# Patient Record
Sex: Female | Born: 1960 | Hispanic: Yes | Marital: Single | Smoking: Current every day smoker
Health system: Southern US, Community
[De-identification: ages and names within clinical notes are randomized; demographics above are authoritative.]

## PROBLEM LIST (undated history)

## (undated) DIAGNOSIS — F32A Depression, unspecified: Secondary | ICD-10-CM

## (undated) DIAGNOSIS — R51 Headache: Secondary | ICD-10-CM

## (undated) DIAGNOSIS — M069 Rheumatoid arthritis, unspecified: Secondary | ICD-10-CM

## (undated) DIAGNOSIS — R519 Headache, unspecified: Secondary | ICD-10-CM

## (undated) DIAGNOSIS — F329 Major depressive disorder, single episode, unspecified: Secondary | ICD-10-CM

## (undated) DIAGNOSIS — K760 Fatty (change of) liver, not elsewhere classified: Secondary | ICD-10-CM

## (undated) DIAGNOSIS — F419 Anxiety disorder, unspecified: Secondary | ICD-10-CM

## (undated) DIAGNOSIS — Z8659 Personal history of other mental and behavioral disorders: Secondary | ICD-10-CM

## (undated) HISTORY — PX: ABDOMINAL HYSTERECTOMY: SHX81

---

## 2010-01-01 ENCOUNTER — Emergency Department: Payer: Self-pay | Admitting: Emergency Medicine

## 2011-08-15 IMAGING — CT CT HEAD WITHOUT CONTRAST
2 series · 16 of 30 positions shown, 20 images · non-contrast
Comparison: none

REASON FOR EXAM: dizziness, unresolved with multiple medications
COMMENTS:   LMP: Post Hysterectomy

[Series 2: without · axial · non-contrast · 0.41mm/px · z∈[+315,+440]mm · 13 of 31 slices shown, 17 images]
[im 3/31  brain]
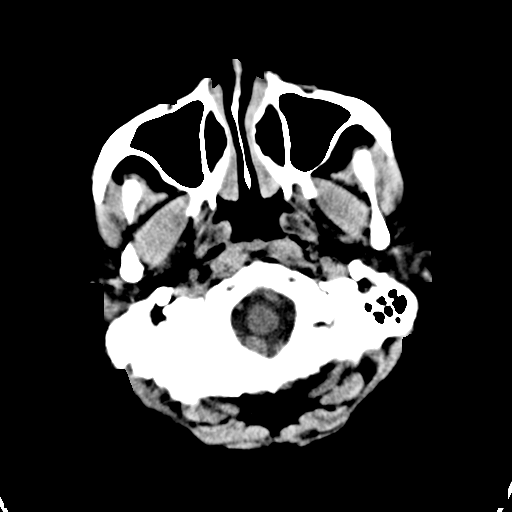
[im 3/31  bone]
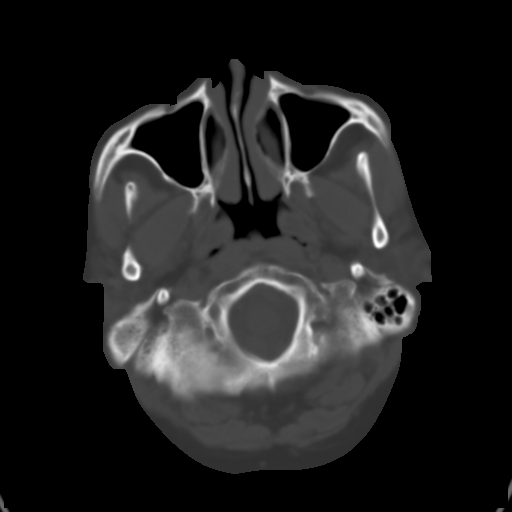
[im 5/31  brain]
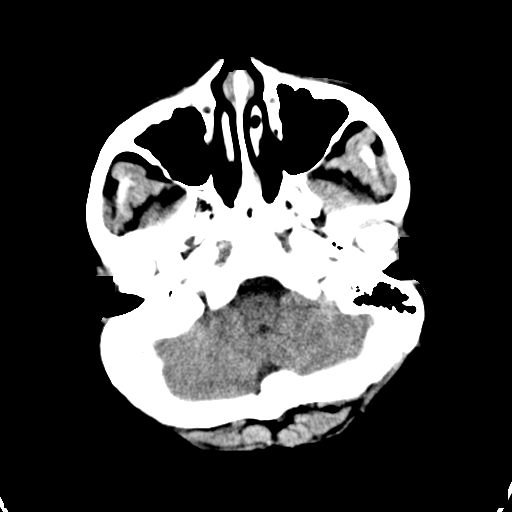
[im 7/31  brain]
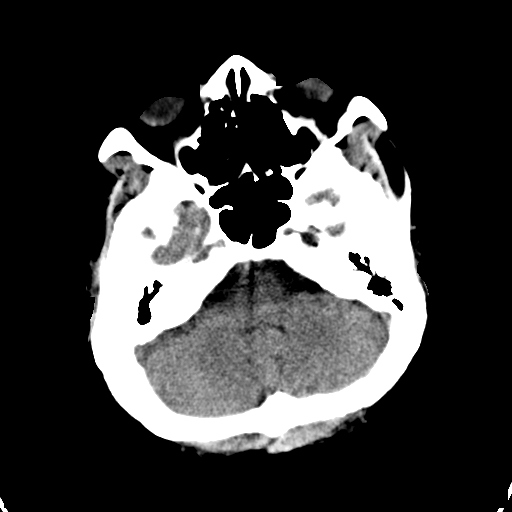
[im 9/31  brain]
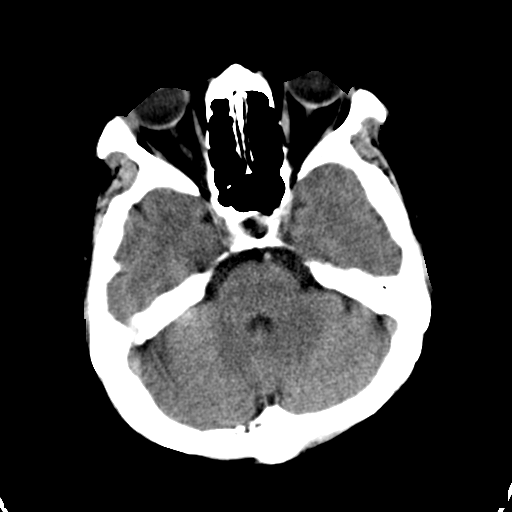
[im 11/31  brain]
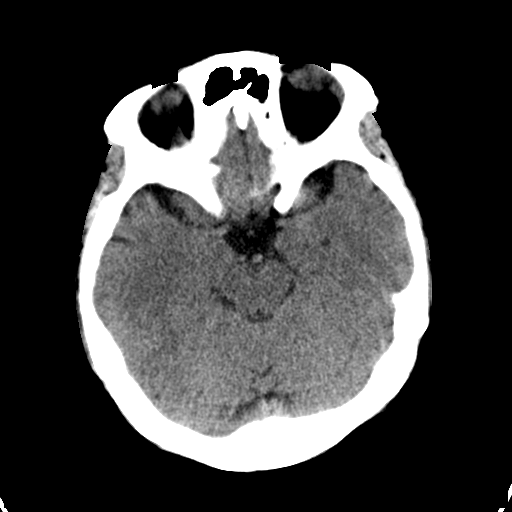
[im 11/31  bone]
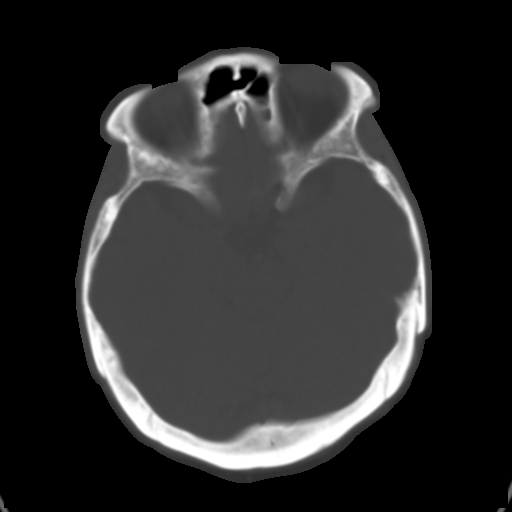
[im 13/31  brain]
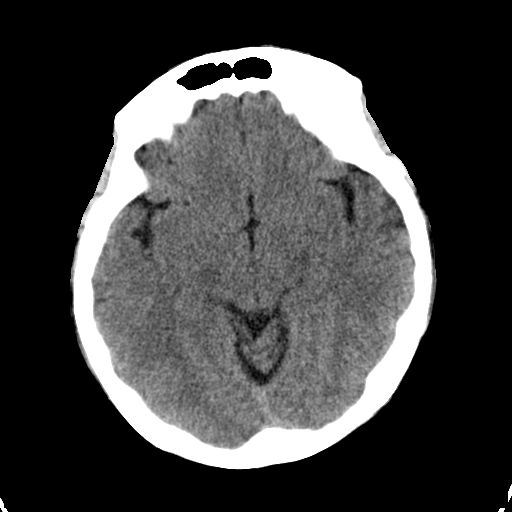
[im 16/31  brain]
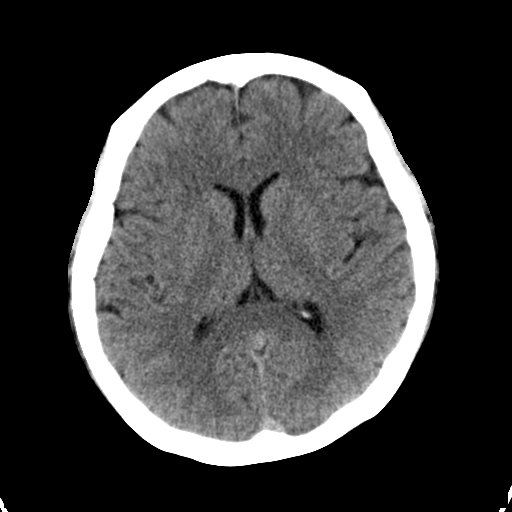
[im 18/31  brain]
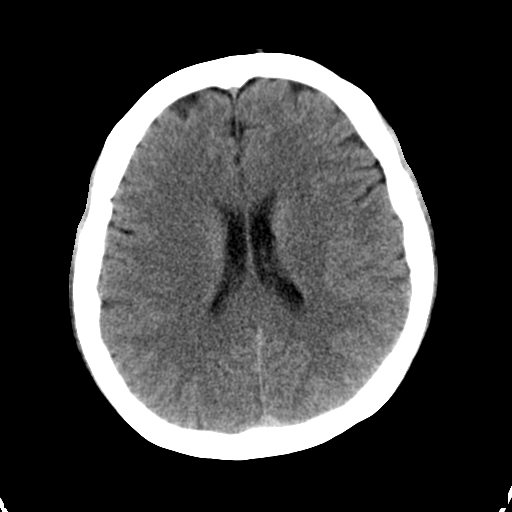
[im 20/31  brain]
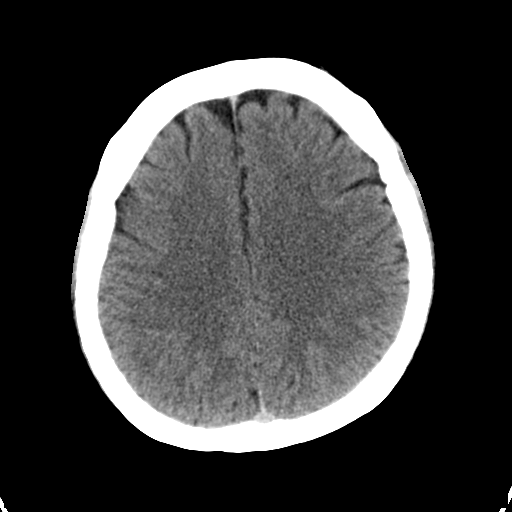
[im 20/31  bone]
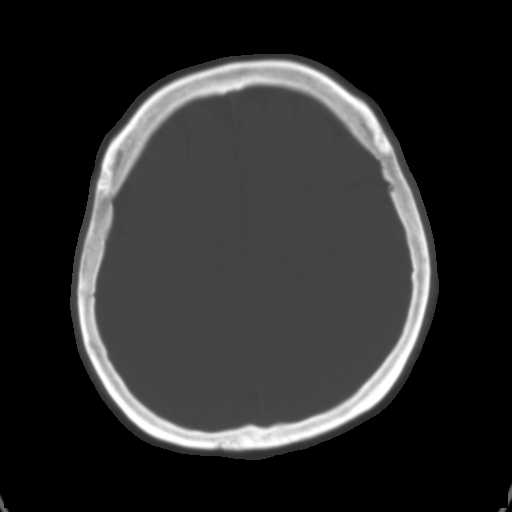
[im 22/31  brain]
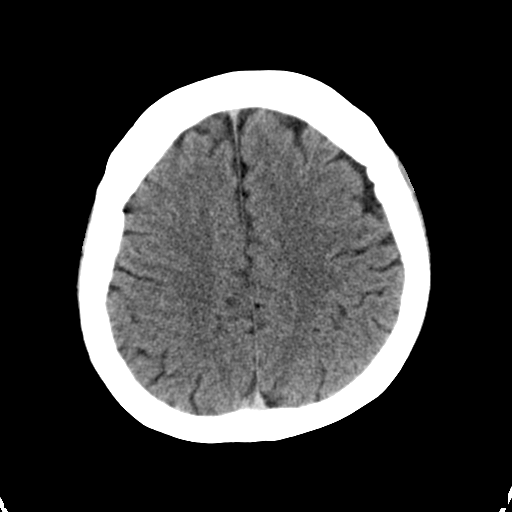
[im 24/31  brain]
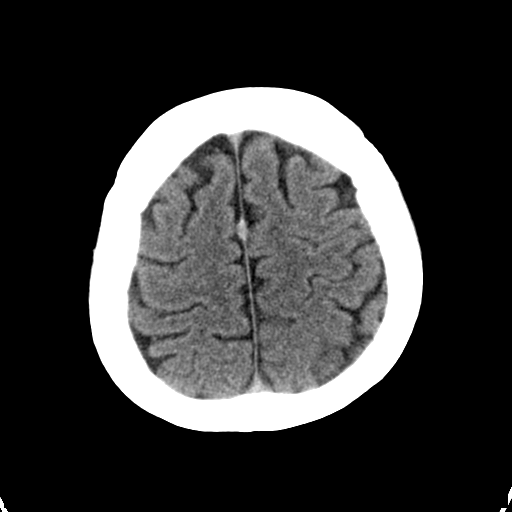
[im 26/31  brain]
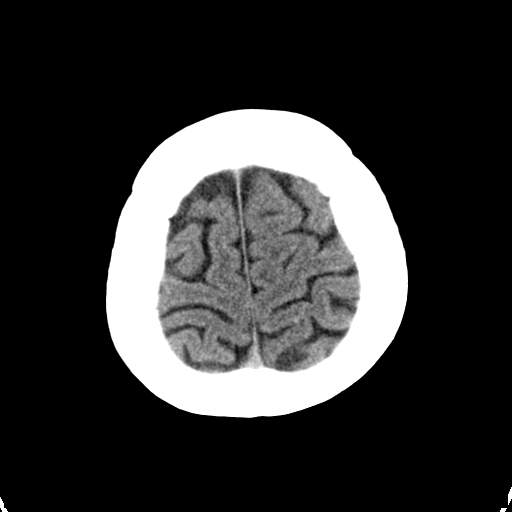
[im 28/31  brain]
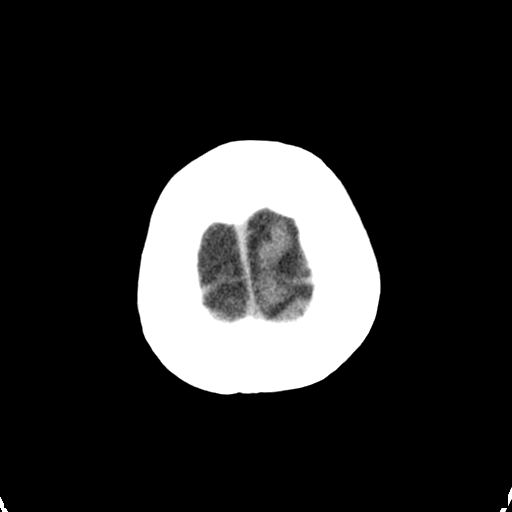
[im 28/31  bone]
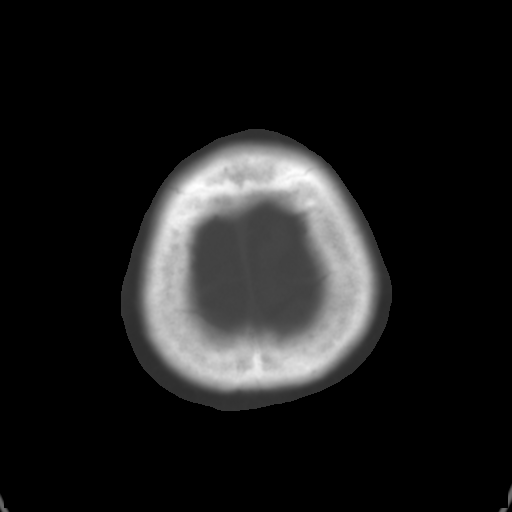

[Series 3: bone · axial · 0.41mm/px · z∈[+315,+355]mm · 3 of 31 slices shown]
[im 3/31  bone]
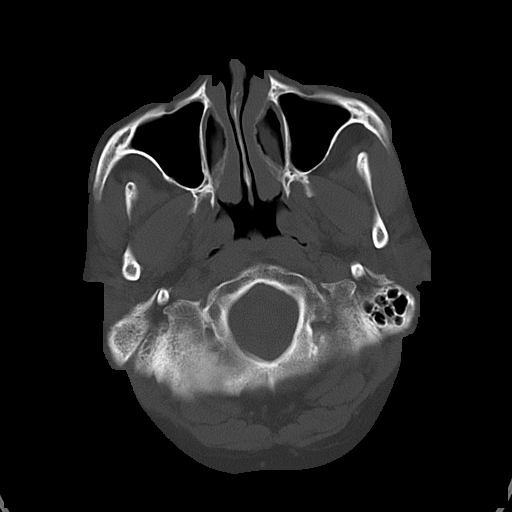
[im 7/31  bone]
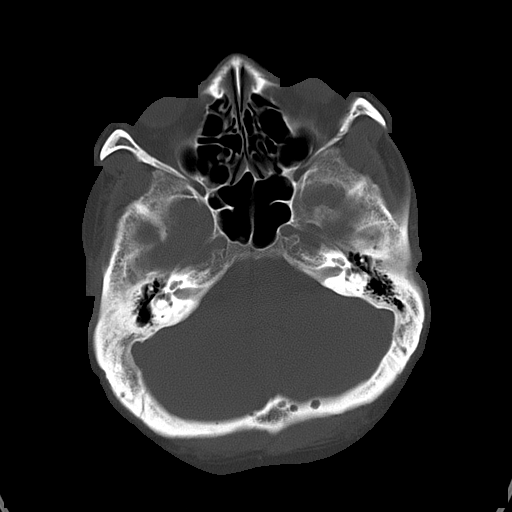
[im 11/31  bone]
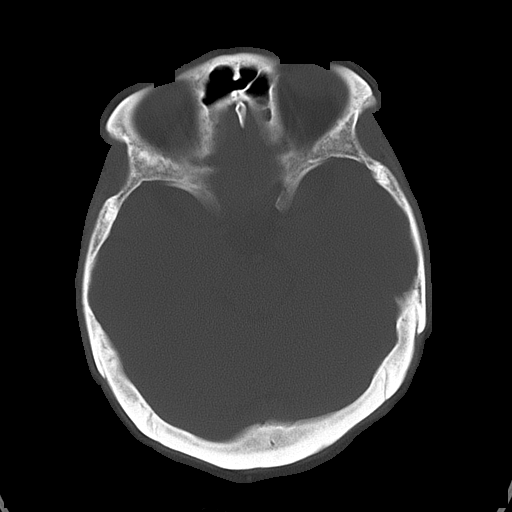

[16 of 30 positions shown; findings below may reference images not displayed]

PROCEDURE:     CT  - CT HEAD WITHOUT CONTRAST  - January 01, 2010  [DATE]

RESULT:     Axial noncontrast CT scanning was performed through the brain at
5 mm intervals and slice thicknesses.

The ventricles are normal in size and position. There is no intracranial
hemorrhage nor intracranial mass effect. The cerebellum and brainstem are
normal in density. A subtle area of increased density along the falx cerebri
is consistent with a focal area of calcification. At bone window settings
the observed portions of the paranasal sinuses are clear. The mastoid air
cells on the right are not as well pneumatized as those on the left. There
is no evidence of an acute skull fracture.
IMPRESSION: I do not see evidence of acute intracranial abnormality.

## 2012-11-29 ENCOUNTER — Ambulatory Visit: Payer: Self-pay | Admitting: Internal Medicine

## 2012-11-29 LAB — COMPREHENSIVE METABOLIC PANEL
Alkaline Phosphatase: 156 U/L — ABNORMAL HIGH (ref 50–136)
Bilirubin,Total: 0.3 mg/dL (ref 0.2–1.0)
Calcium, Total: 8.6 mg/dL (ref 8.5–10.1)
Chloride: 108 mmol/L — ABNORMAL HIGH (ref 98–107)
Co2: 23 mmol/L (ref 21–32)
Creatinine: 0.77 mg/dL (ref 0.60–1.30)
EGFR (African American): >60
EGFR (Non-African Amer.): >60
Osmolality: 280 (ref 275–301)
Potassium: 3.7 mmol/L (ref 3.5–5.1)
SGPT (ALT): 133 U/L — ABNORMAL HIGH (ref 12–78)
Sodium: 140 mmol/L (ref 136–145)

## 2012-11-29 LAB — URINALYSIS, COMPLETE
Ketone: NEGATIVE
Ph: 6 (ref 4.5–8.0)
Protein: NEGATIVE
Specific Gravity: >=1.03 (ref 1.003–1.030)

## 2012-11-29 LAB — CBC WITH DIFFERENTIAL/PLATELET
Basophil #: 0.1 10*3/uL (ref 0.0–0.1)
Basophil %: 0.8 %
Eosinophil #: 0.1 10*3/uL (ref 0.0–0.7)
HGB: 15.4 g/dL (ref 12.0–16.0)
Lymphocyte %: 36.9 %
MCH: 29.7 pg (ref 26.0–34.0)
MCHC: 33.5 g/dL (ref 32.0–36.0)
Monocyte %: 7.9 %
Neutrophil %: 53.5 %
Platelet: 305 10*3/uL (ref 150–440)
WBC: 8.2 10*3/uL (ref 3.6–11.0)

## 2012-11-30 LAB — URINE CULTURE

## 2012-12-25 ENCOUNTER — Ambulatory Visit: Payer: Self-pay | Admitting: Family Medicine

## 2013-10-21 ENCOUNTER — Ambulatory Visit: Payer: Self-pay | Admitting: Internal Medicine

## 2015-10-07 ENCOUNTER — Ambulatory Visit
Admission: EM | Admit: 2015-10-07 | Discharge: 2015-10-07 | Disposition: A | Discharge status: Home / Self Care | Payer: 59 | Attending: Family Medicine | Admitting: Family Medicine

## 2015-10-07 ENCOUNTER — Encounter: Payer: Self-pay | Admitting: Emergency Medicine

## 2015-10-07 DIAGNOSIS — H66015 Acute suppurative otitis media with spontaneous rupture of ear drum, recurrent, left ear: Secondary | ICD-10-CM

## 2015-10-07 DIAGNOSIS — J019 Acute sinusitis, unspecified: Secondary | ICD-10-CM

## 2015-10-07 DIAGNOSIS — H6691 Otitis media, unspecified, right ear: Secondary | ICD-10-CM

## 2015-10-07 MED ORDER — FLUTICASONE PROPIONATE 50 MCG/ACT NA SUSP: 1.0000 | Freq: Two times a day (BID) | NASAL | Status: DC

## 2015-10-07 MED ORDER — AMOXICILLIN-POT CLAVULANATE 875-125 MG PO TABS: 1.0000 | ORAL_TABLET | Freq: Two times a day (BID) | ORAL | Status: DC

## 2015-10-07 MED ORDER — SALINE SPRAY 0.65 % NA SOLN: 2.0000 | NASAL | Status: DC

## 2015-10-07 MED ORDER — IBUPROFEN 800 MG PO TABS: 800.0000 mg | ORAL_TABLET | Freq: Three times a day (TID) | ORAL | Status: DC

## 2015-10-07 NOTE — ED Provider Notes (Signed)
CSN: [id number]     Arrival date & time 10/07/15  1026 History   First MD Initiated Contact with Patient 10/07/15 1117     Chief Complaint  Patient presents with  . Sore Throat  . 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    History reviewed. No pertinent past medical history. Past Surgical History  Procedure Laterality Date  . Abdominal hysterectomy     History reviewed. No pertinent family history. Social History  Substance Use Topics  . Smoking status: Current Every Day Smoker -- 0.50 packs/day    Types: Cigarettes  . Smokeless tobacco: None  . Alcohol Use: No   OB History    No data available     Review of Systems  Constitutional: Positive for chills and diaphoresis. Negative for fever, activity change, appetite change, fatigue and unexpected weight change.  HENT: Positive for ear discharge, ear pain, postnasal drip, rhinorrhea, sinus pressure and sore throat. Negative for congestion, dental problem, drooling, facial swelling, hearing loss, mouth sores, nosebleeds, sneezing, tinnitus, trouble swallowing and voice change.   Eyes: Negative for photophobia, pain, discharge, redness, itching and visual disturbance.  Respiratory: Negative for cough, choking, shortness of breath, wheezing and stridor.   Cardiovascular: Negative for chest pain, palpitations and leg swelling.  Gastrointestinal: Negative for nausea, vomiting, abdominal pain, diarrhea, constipation, blood in stool and abdominal distention.  Endocrine: Negative for cold intolerance and heat intolerance.  Genitourinary:  Negative for dysuria.  Musculoskeletal: Negative for myalgias, back pain, joint swelling, arthralgias, gait problem, neck pain and neck stiffness.  Skin: Negative for color change, pallor, rash and wound.  Allergic/Immunologic: Positive for environmental allergies. Negative for food allergies.  Neurological: Negative for dizziness, tremors, seizures, syncope, facial asymmetry, speech difficulty, weakness, light-headedness and numbness.  Hematological: Negative for adenopathy. Does not bruise/bleed easily.  Psychiatric/Behavioral: Negative for behavioral problems, confusion, sleep disturbance and agitation.    Allergies  Septra  Home Medications   Prior to Admission medications   Medication Sig Start Date End Date Taking? Authorizing Provider  amoxicillin-clavulanate (AUGMENTIN) 875-125 MG tablet Take 1 tablet by mouth every 12 (twelve) hours. 10/07/15   Barbaraann Barthel, NP  fluticasone (FLONASE) 50 MCG/ACT nasal spray Place 1 spray into both nostrils 2 (two) times daily. 10/07/15   Barbaraann Barthel, NP  ibuprofen (ADVIL,MOTRIN) 800 MG tablet Take 1 tablet (800 mg total) by mouth 3 (three) times daily. 10/07/15   Barbaraann Barthel, NP  sodium chloride (OCEAN) 0.65 % SOLN nasal spray Place 2 sprays into both nostrils every 2 (two) hours while awake. 10/07/15   Barbaraann Barthel, NP   Meds Ordered and Administered this Visit  Medications - No data to display  BP 127/77 mmHg  Pulse 56  Temp(Src) 98.1 F (36.7 C) (Tympanic)  Resp 16  Ht 5' (1.524 m)  Wt 170 lb (77.111 kg)  BMI 33.20 kg/m2  SpO2 99% No data found.   Physical Exam  Constitutional: She is oriented to person, place, and time. Vital signs are normal. She appears well-developed and well-nourished. No distress.  HENT:  Head: Normocephalic and atraumatic.  Right Ear: Hearing, external ear and ear canal normal. Tympanic  membrane is perforated, erythematous and bulging. A middle ear effusion is present.  Left Ear:  Hearing, external ear and ear canal normal. Tympanic membrane is erythematous. A middle ear effusion is present.  Nose: Mucosal edema and rhinorrhea present. No nose lacerations, sinus tenderness, nasal deformity, septal deviation or nasal septal hematoma. No epistaxis.  No foreign bodies. Right sinus exhibits no maxillary sinus tenderness and no frontal sinus tenderness. Left sinus exhibits no maxillary sinus tenderness and no frontal sinus tenderness.  Mouth/Throat: Uvula is midline and mucous membranes are normal. Mucous membranes are not pale, not dry and not cyanotic. She does not have dentures. No oral lesions. No trismus in the jaw. Normal dentition. No dental abscesses, uvula swelling, lacerations or dental caries. Posterior oropharyngeal edema and posterior oropharyngeal erythema present. No oropharyngeal exudate or tonsillar abscesses.  Left TM with perforation 2 oclock opacity air fluid level TM bulging with erythema 6 oclock external canals bilaterally with erythema and debris; right TM with air fluid level slight opacity; cobblestoning posteiror pharynx drip from above opaque white tonsils 1-2+/4 bilaterally edema; bilateral nasal turbinates with edema/erythema  Eyes: Conjunctivae, EOM and lids are normal. Pupils are equal, round, and reactive to light. Right eye exhibits no chemosis, no discharge, no exudate and no hordeolum. No foreign body present in the right eye. Left eye exhibits no chemosis, no discharge, no exudate and no hordeolum. No foreign body present in the left eye. Right conjunctiva is not injected. Right conjunctiva has no hemorrhage. Left conjunctiva is not injected. Left conjunctiva has no hemorrhage. No scleral icterus. Right eye exhibits normal extraocular motion and no nystagmus. Left eye exhibits normal extraocular motion and no nystagmus. Right pupil is round and reactive. Left pupil is round and reactive. Pupils are equal.  Neck: Trachea normal and normal range of motion.  Neck supple. No tracheal tenderness, no spinous process tenderness and no muscular tenderness present. No rigidity. No tracheal deviation, no edema, no erythema and normal range of motion present. No thyroid mass and no thyromegaly present.  Cardiovascular: Normal rate, regular rhythm, S1 normal, S2 normal, normal heart sounds and intact distal pulses.  Exam reveals no gallop and no friction rub.   No murmur heard. Pulmonary/Chest: Effort normal and breath sounds normal. No accessory muscle usage or stridor. No respiratory distress. She has no decreased breath sounds. She has no wheezes. She has no rhonchi. She has no rales. She exhibits no tenderness.  Abdominal: Soft. Bowel sounds are normal. She exhibits no distension and no mass. There is no tenderness. There is no rebound and no guarding.  Musculoskeletal: Normal range of motion. She exhibits no edema or tenderness.  Lymphadenopathy:       Head (right side): No submental, no submandibular, no tonsillar, no preauricular, no posterior auricular and no occipital adenopathy present.       Head (left side): No submental, no submandibular, no tonsillar, no preauricular, no posterior auricular and no occipital adenopathy present.    She has no cervical adenopathy.       Right cervical: No superficial cervical, no deep cervical and no posterior cervical adenopathy present.      Left cervical: No superficial cervical, no deep cervical and no posterior cervical adenopathy present.  Neurological: She is alert and oriented to person, place, and time. She is not disoriented. She displays no atrophy and no tremor. No sensory deficit. She exhibits normal muscle tone. She displays no seizure activity. Coordination and gait normal. GCS eye subscore is 4. GCS verbal  subscore is 5. GCS motor subscore is 6.  Skin: Skin is warm, dry and intact. No rash noted. She is not diaphoretic. No erythema. No pallor.  Psychiatric: She has a normal mood and affect. Her speech is  normal and behavior is normal. Judgment and thought content normal. Cognition and memory are normal.  Nursing note and vitals reviewed.   ED Course  Procedures (including critical care time)  Labs Review Labs Reviewed - No data to display  Imaging Review No results found.     MDM   1. Recurrent acute suppurative otitis media with spontaneous rupture of left tympanic membrane   2. Recurrent acute otitis media of right ear, unspecified otitis media type   3. Acute rhinosinusitis   Rx augmentin 875mg  po BID x 10 days.  M otrin 800mg  po TID prn pain. Keep appt already scheduled with Dr Elenore Rota for re-evlauation of ruptured TM left.  Follow up sooner if worsening of symptoms.  Avoid immersion of head in pool/lake/river/bath.  May shower.  Do not use qtips in ears.  May clean ears with washcloth. Treatment as ordered.  Symptomatic therapy suggested fluids, NSAIDs and rest.  May take Tylenol or Motrin for fevers.  Call or return to clinic as needed if these symptoms worsen or fail to improve as anticipated. Exitcare handout on otitis media given to patient.  Patient verbalized agreement and understanding of treatment plan.   P2:  Hand washing  flonase 1 spray each nostril BID .  Nasal saline 2 sprays each nostril q2h while awake prn nasal/sinus congestion. Patient being treated with augmentin for otitis media.  Motrin 800mg  po TID prn pain/fever.  No evidence of systemic bacterial infection, non toxic and well hydrated.  I do not see where any further testing or imaging is necessary at this time.   I will suggest supportive care, rest, good hygiene and encourage the patient to take adequate fluids.  The patient is to return to clinic or EMERGENCY ROOM if symptoms worsen or change significantly.  Exitcare handout on sinusitis given to patient.  Patient verbalized agreement and understanding of treatment plan and had no further questions at this time.   P2:  Hand washing and cover cough    Barbaraann Barthel, NP 10/08/15 [phone]

## 2015-10-07 NOTE — Discharge Instructions (Signed)
Otitis Media, Adult Otitis media is redness, soreness, and inflammation of the middle ear. Otitis media may be caused by allergies or, most commonly, by infection. Often it occurs as a complication of the common cold. SIGNS AND SYMPTOMS Symptoms of otitis media may include:  Earache.  Fever.  Ringing in your ear.  Headache.  Leakage of fluid from the ear. DIAGNOSIS To diagnose otitis media, your health care provider will examine your ear with an otoscope. This is an instrument that allows your health care provider to see into your ear in order to examine your eardrum. Your health care provider also will ask you questions about your symptoms. TREATMENT  Typically, otitis media resolves on its own within 3-5 days. Your health care provider may prescribe medicine to ease your symptoms of pain. If otitis media does not resolve within 5 days or is recurrent, your health care provider may prescribe antibiotic medicines if he or she suspects that a bacterial infection is the cause. HOME CARE INSTRUCTIONS   If you were prescribed an antibiotic medicine, finish it all even if you start to feel better.  Take medicines only as directed by your health care provider.  Keep all follow-up visits as directed by your health care provider. SEEK MEDICAL CARE IF:  You have otitis media only in one ear, or bleeding from your nose, or both.  You notice a lump on your neck.  You are not getting better in 3-5 days.  You feel worse instead of better. SEEK IMMEDIATE MEDICAL CARE IF:   You have pain that is not controlled with medicine.  You have swelling, redness, or pain around your ear or stiffness in your neck.  You notice that part of your face is paralyzed.  You notice that the bone behind your ear (mastoid) is tender when you touch it. MAKE SURE YOU:   Understand these instructions.  Will watch your condition.  Will get help right away if you are not doing well or get worse.   This  information is not intended to replace advice given to you by your health care provider. Make sure you discuss any questions you have with your health care provider.   Document Released: 09/08/2004 Document Revised: 12/25/2014 Document Reviewed: 07/01/2013 Elsevier Interactive Patient Education 2016 [employer] Hay Fever Hay fever is an allergic reaction to particles in the air. It cannot be passed from person to person. It cannot be cured, but it can be controlled. CAUSES  Hay fever is caused by something that triggers an allergic reaction (allergens). The following are examples of allergens:  Ragweed.  Feathers.  Animal dander.  Grass and tree pollens.  Cigarette smoke.  House dust.  Pollution. SYMPTOMS   Sneezing.  Runny or stuffy nose.  Tearing eyes.  Itchy eyes, nose, mouth, throat, skin, or other area.  Sore throat.  Headache.  Decreased sense of smell or taste. DIAGNOSIS Your caregiver will perform a physical exam and ask questions about the symptoms you are having.Allergy testing may be done to determine exactly what triggers your hay fever.  TREATMENT   Over-the-counter medicines may help symptoms. These include:  Antihistamines.  Decongestants. These may help with nasal congestion.  Your caregiver may prescribe medicines if over-the-counter medicines do not work.  Some people benefit from allergy shots when other medicines are not helpful. HOME CARE INSTRUCTIONS   Avoid the allergen that is causing your symptoms, if possible.  Take all medicine as told by your caregiver. SEEK MEDICAL CARE IF:  You have severe allergy symptoms and your current medicines are not helping.  Your treatment was working at one time, but you are now experiencing symptoms.  You have sinus congestion and pressure.  You develop a fever or headache.  You have thick nasal discharge.  You have asthma and have a worsening cough and wheezing. SEEK IMMEDIATE MEDICAL  CARE IF:   You have swelling of your tongue or lips.  You have trouble breathing.  You feel lightheaded or like you are going to faint.  You have cold sweats.  You have a fever.   This information is not intended to replace advice given to you by your health care provider. Make sure you discuss any questions you have with your health care provider.   Document Released: 12/04/2005 Document Revised: 02/26/2012 Document Reviewed: 06/16/2015 Elsevier Interactive Patient Education 2016 [employer] Sinusitis, Adult Sinusitis is redness, soreness, and inflammation of the paranasal sinuses. Paranasal sinuses are air pockets within the bones of your face. They are located beneath your eyes, in the middle of your forehead, and above your eyes. In healthy paranasal sinuses, mucus is able to drain out, and air is able to circulate through them by way of your nose. However, when your paranasal sinuses are inflamed, mucus and air can become trapped. This can allow bacteria and other germs to grow and cause infection. Sinusitis can develop quickly and last only a short time (acute) or continue over a long period (chronic). Sinusitis that lasts for more than 12 weeks is considered chronic. CAUSES Causes of sinusitis include:  Allergies.  Structural abnormalities, such as displacement of the cartilage that separates your nostrils (deviated septum), which can decrease the air flow through your nose and sinuses and affect sinus drainage.  Functional abnormalities, such as when the small hairs (cilia) that line your sinuses and help remove mucus do not work properly or are not present. SIGNS AND SYMPTOMS Symptoms of acute and chronic sinusitis are the same. The primary symptoms are pain and pressure around the affected sinuses. Other symptoms include:  Upper toothache.  Earache.  Headache.  Bad breath.  Decreased sense of smell and taste.  A cough, which worsens when you are lying  flat.  Fatigue.  Fever.  Thick drainage from your nose, which often is green and may contain pus (purulent).  Swelling and warmth over the affected sinuses. DIAGNOSIS Your health care provider will perform a physical exam. During your exam, your health care provider may perform any of the following to help determine if you have acute sinusitis or chronic sinusitis:  Look in your nose for signs of abnormal growths in your nostrils (nasal polyps).  Tap over the affected sinus to check for signs of infection.  View the inside of your sinuses using an imaging device that has a light attached (endoscope). If your health care provider suspects that you have chronic sinusitis, one or more of the following tests may be recommended:  Allergy tests.  Nasal culture. A sample of mucus is taken from your nose, sent to a lab, and screened for bacteria.  Nasal cytology. A sample of mucus is taken from your nose and examined by your health care provider to determine if your sinusitis is related to an allergy. TREATMENT Most cases of acute sinusitis are related to a viral infection and will resolve on their own within 10 days. Sometimes, medicines are prescribed to help relieve symptoms of both acute and chronic sinusitis. These may include pain medicines, decongestants,  nasal steroid sprays, or saline sprays. However, for sinusitis related to a bacterial infection, your health care provider will prescribe antibiotic medicines. These are medicines that will help kill the bacteria causing the infection. Rarely, sinusitis is caused by a fungal infection. In these cases, your health care provider will prescribe antifungal medicine. For some cases of chronic sinusitis, surgery is needed. Generally, these are cases in which sinusitis recurs more than 3 times per year, despite other treatments. HOME CARE INSTRUCTIONS  Drink plenty of water. Water helps thin the mucus so your sinuses can drain more  easily.  Use a humidifier.  Inhale steam 3-4 times a day (for example, sit in the bathroom with the shower running).  Apply a warm, moist washcloth to your face 3-4 times a day, or as directed by your health care provider.  Use saline nasal sprays to help moisten and clean your sinuses.  Take medicines only as directed by your health care provider.  If you were prescribed either an antibiotic or antifungal medicine, finish it all even if you start to feel better. SEEK IMMEDIATE MEDICAL CARE IF:  You have increasing pain or severe headaches.  You have nausea, vomiting, or drowsiness.  You have swelling around your face.  You have vision problems.  You have a stiff neck.  You have difficulty breathing.   This information is not intended to replace advice given to you by your health care provider. Make sure you discuss any questions you have with your health care provider.   Document Released: 12/04/2005 Document Revised: 12/25/2014 Document Reviewed: 12/19/2011 Elsevier Interactive Patient Education [employer]

## 2015-10-07 NOTE — ED Notes (Signed)
Patient c/o sore throat, runny nose, cough and HAs for 3 days.

## 2016-06-22 ENCOUNTER — Encounter
Admission: RE | Admit: 2016-06-22 | Discharge: 2016-06-22 | Disposition: A | Discharge status: Home / Self Care | Payer: 59 | Source: Ambulatory Visit | Attending: Orthopedic Surgery | Admitting: Orthopedic Surgery

## 2016-06-22 DIAGNOSIS — Z01818 Encounter for other preprocedural examination: Secondary | ICD-10-CM | POA: Insufficient documentation

## 2016-06-22 HISTORY — DX: Headache, unspecified: R51.9

## 2016-06-22 HISTORY — DX: Personal history of other mental and behavioral disorders: Z86.59

## 2016-06-22 HISTORY — DX: Fatty (change of) liver, not elsewhere classified: K76.0

## 2016-06-22 HISTORY — DX: Major depressive disorder, single episode, unspecified: F32.9

## 2016-06-22 HISTORY — DX: Depression, unspecified: F32.A

## 2016-06-22 HISTORY — DX: Anxiety disorder, unspecified: F41.9

## 2016-06-22 HISTORY — DX: Headache: R51

## 2016-06-22 NOTE — Patient Instructions (Signed)
  Your procedure is scheduled on:June 29, 2016 (Thursday) Report to Same Day Surgery 2nd floor [facility] To find out your arrival time please call [phone] between 1PM - 3PM on June 28, 2016 (Wednesday)  Remember: Instructions that are not followed completely may result in serious medical risk, up to and including death, or upon the discretion of your surgeon and anesthesiologist your surgery may need to be rescheduled.    _x___ 1. Do not eat food or drink liquids after midnight. No gum chewing or hard candies.     __x__ 2. No Alcohol for 24 hours before or after surgery.   __x__3. No Smoking for 24 prior to surgery.   ____  4. Bring all medications with you on the day of surgery if instructed.    __x__ 5. Notify your doctor if there is any change in your medical condition     (cold, fever, infections).     Do not wear jewelry, make-up, hairpins, clips or nail polish.  Do not wear lotions, powders, or perfumes. You may wear deodorant.  Do not shave 48 hours prior to surgery. Men may shave face and neck.  Do not bring valuables to the hospital.    [facility] is not responsible for any belongings or valuables.               Contacts, dentures or bridgework may not be worn into surgery.  Leave your suitcase in the car. After surgery it may be brought to your room.  For patients admitted to the hospital, discharge time is determined by your treatment team.   Patients discharged the day of surgery will not be allowed to drive home.    Please read over the following fact sheets that you were given:   [facility] Preparing for Surgery and or MRSA Information   _x___ Take these medicines the morning of surgery with A SIP OF WATER:    1.   2.  3.  4.  5.  6.  ____ Fleet Enema (as directed)   _x___ Use CHG Soap or sage wipes as directed on instruction sheet   ____ Use inhalers on the day of surgery and bring to hospital day of surgery  ____ Stop metformin 2 days prior  to surgery    ____ Take 1/2 of usual insulin dose the night before surgery and none on the morning of surgery            _x___ Stop aspirin or coumadin, or plavix ( Patient stated has already stopped Aspirin one week ago)  _x__ Stop Anti-inflammatories such as Advil, Aleve, Ibuprofen, Motrin, Naproxen,          Naprosyn, Goodies powders or aspirin products. Ok to take Tylenol.   ____ Stop supplements until after surgery.    ____ Bring C-Pap to the hospital.

## 2016-06-29 ENCOUNTER — Ambulatory Visit: Payer: 59 | Admitting: *Deleted

## 2016-06-29 ENCOUNTER — Encounter: Payer: Self-pay | Admitting: *Deleted

## 2016-06-29 ENCOUNTER — Encounter
Admission: RE | Disposition: A | Discharge status: Home / Self Care | Payer: Self-pay | Source: Ambulatory Visit | Attending: Orthopedic Surgery

## 2016-06-29 ENCOUNTER — Ambulatory Visit
Admission: RE | Admit: 2016-06-29 | Discharge: 2016-06-29 | Disposition: A | Discharge status: Home / Self Care | Payer: 59 | Source: Ambulatory Visit | Attending: Orthopedic Surgery | Admitting: Orthopedic Surgery

## 2016-06-29 DIAGNOSIS — Z8249 Family history of ischemic heart disease and other diseases of the circulatory system: Secondary | ICD-10-CM | POA: Insufficient documentation

## 2016-06-29 DIAGNOSIS — Z833 Family history of diabetes mellitus: Secondary | ICD-10-CM | POA: Diagnosis not present

## 2016-06-29 DIAGNOSIS — Z8349 Family history of other endocrine, nutritional and metabolic diseases: Secondary | ICD-10-CM | POA: Diagnosis not present

## 2016-06-29 DIAGNOSIS — Z882 Allergy status to sulfonamides status: Secondary | ICD-10-CM | POA: Diagnosis not present

## 2016-06-29 DIAGNOSIS — G5601 Carpal tunnel syndrome, right upper limb: Secondary | ICD-10-CM | POA: Insufficient documentation

## 2016-06-29 DIAGNOSIS — K760 Fatty (change of) liver, not elsewhere classified: Secondary | ICD-10-CM | POA: Insufficient documentation

## 2016-06-29 DIAGNOSIS — Z791 Long term (current) use of non-steroidal anti-inflammatories (NSAID): Secondary | ICD-10-CM | POA: Insufficient documentation

## 2016-06-29 DIAGNOSIS — F419 Anxiety disorder, unspecified: Secondary | ICD-10-CM | POA: Diagnosis not present

## 2016-06-29 DIAGNOSIS — Z79899 Other long term (current) drug therapy: Secondary | ICD-10-CM | POA: Insufficient documentation

## 2016-06-29 DIAGNOSIS — Z7951 Long term (current) use of inhaled steroids: Secondary | ICD-10-CM | POA: Insufficient documentation

## 2016-06-29 DIAGNOSIS — Z90710 Acquired absence of both cervix and uterus: Secondary | ICD-10-CM | POA: Diagnosis not present

## 2016-06-29 DIAGNOSIS — F329 Major depressive disorder, single episode, unspecified: Secondary | ICD-10-CM | POA: Diagnosis not present

## 2016-06-29 DIAGNOSIS — Z8379 Family history of other diseases of the digestive system: Secondary | ICD-10-CM | POA: Diagnosis not present

## 2016-06-29 DIAGNOSIS — Z7982 Long term (current) use of aspirin: Secondary | ICD-10-CM | POA: Insufficient documentation

## 2016-06-29 DIAGNOSIS — F17210 Nicotine dependence, cigarettes, uncomplicated: Secondary | ICD-10-CM | POA: Diagnosis not present

## 2016-06-29 HISTORY — PX: CARPAL TUNNEL RELEASE: SHX101

## 2016-06-29 SURGERY — CARPAL TUNNEL RELEASE
Anesthesia: General | Laterality: Right | Wound class: Clean

## 2016-06-29 MED ORDER — PROMETHAZINE HCL 25 MG/ML IJ SOLN
INTRAMUSCULAR | Status: AC
  Administered 2016-06-29: 12.5 mg via INTRAVENOUS
  Filled 2016-06-29: qty 1

## 2016-06-29 MED ORDER — ONDANSETRON HCL 4 MG/2ML IJ SOLN
INTRAMUSCULAR | Status: DC | PRN
  Administered 2016-06-29: 4 mg via INTRAVENOUS

## 2016-06-29 MED ORDER — LIDOCAINE HCL (CARDIAC) 20 MG/ML IV SOLN
INTRAVENOUS | Status: DC | PRN
  Administered 2016-06-29: 80 mg via INTRAVENOUS

## 2016-06-29 MED ORDER — FENTANYL CITRATE (PF) 100 MCG/2ML IJ SOLN
INTRAMUSCULAR | Status: DC | PRN
  Administered 2016-06-29: 25 ug via INTRAVENOUS

## 2016-06-29 MED ORDER — LACTATED RINGERS IV SOLN
INTRAVENOUS | Status: DC | PRN
  Administered 2016-06-29: 09:00:00 via INTRAVENOUS

## 2016-06-29 MED ORDER — SCOPOLAMINE 1 MG/3DAYS TD PT72
1.0000 | MEDICATED_PATCH | Freq: Once | TRANSDERMAL | Status: DC
  Administered 2016-06-29: 1.5 mg via TRANSDERMAL

## 2016-06-29 MED ORDER — HYDROCODONE-ACETAMINOPHEN 5-325 MG PO TABS
1.0000 | ORAL_TABLET | Freq: Four times a day (QID) | ORAL | Status: DC | PRN
Start: 2016-06-29 — End: 2016-11-07

## 2016-06-29 MED ORDER — LACTATED RINGERS IV SOLN
INTRAVENOUS | Status: DC
  Administered 2016-06-29: 08:00:00 via INTRAVENOUS

## 2016-06-29 MED ORDER — PROMETHAZINE HCL 25 MG/ML IJ SOLN
6.2500 mg | INTRAMUSCULAR | Status: DC | PRN
  Administered 2016-06-29: 12.5 mg via INTRAVENOUS

## 2016-06-29 MED ORDER — SCOPOLAMINE 1 MG/3DAYS TD PT72
MEDICATED_PATCH | TRANSDERMAL | Status: AC
  Filled 2016-06-29: qty 1

## 2016-06-29 MED ORDER — PROPOFOL 10 MG/ML IV BOLUS
INTRAVENOUS | Status: DC | PRN
  Administered 2016-06-29: 60 mg via INTRAVENOUS
  Administered 2016-06-29: 140 mg via INTRAVENOUS

## 2016-06-29 MED ORDER — MIDAZOLAM HCL 2 MG/2ML IJ SOLN
INTRAMUSCULAR | Status: DC | PRN
  Administered 2016-06-29: 2 mg via INTRAVENOUS

## 2016-06-29 MED ORDER — BUPIVACAINE HCL (PF) 0.5 % IJ SOLN
INTRAMUSCULAR | Status: AC
  Filled 2016-06-29: qty 30

## 2016-06-29 MED ORDER — SODIUM CHLORIDE 0.9 % IJ SOLN
INTRAMUSCULAR | Status: AC
  Filled 2016-06-29: qty 10

## 2016-06-29 MED ORDER — FAMOTIDINE 20 MG PO TABS
ORAL_TABLET | ORAL | Status: AC
Start: 2016-06-29 — End: 2016-06-29
  Administered 2016-06-29: 20 mg via ORAL
  Filled 2016-06-29: qty 1

## 2016-06-29 MED ORDER — FENTANYL CITRATE (PF) 100 MCG/2ML IJ SOLN: 25.0000 ug | INTRAMUSCULAR | Status: DC | PRN

## 2016-06-29 MED ORDER — BUPIVACAINE HCL 0.5 % IJ SOLN
INTRAMUSCULAR | Status: DC | PRN
Start: 2016-06-29 — End: 2016-06-29
  Administered 2016-06-29: 10 mL

## 2016-06-29 MED ORDER — FAMOTIDINE 20 MG PO TABS
20.0000 mg | ORAL_TABLET | Freq: Once | ORAL | Status: AC
  Administered 2016-06-29: 20 mg via ORAL

## 2016-06-29 SURGICAL SUPPLY — 27 items
BANDAGE ACE 3X5.8 VEL STRL LF (GAUZE/BANDAGES/DRESSINGS) ×3 IMPLANT
BNDG ESMARK 4X12 TAN STRL LF (GAUZE/BANDAGES/DRESSINGS) ×3 IMPLANT
CANISTER SUCT 1200ML W/VALVE (MISCELLANEOUS) ×3 IMPLANT
CHLORAPREP W/TINT 26ML (MISCELLANEOUS) ×3 IMPLANT
CUFF TOURN 18 STER (MISCELLANEOUS) ×3 IMPLANT
ELECT CAUTERY NEEDLE 2.0 MIC (NEEDLE) IMPLANT
ELECT CAUTERY NEEDLE TIP 1.0 (MISCELLANEOUS)
ELECTRODE CAUTERY NEDL TIP 1.0 (MISCELLANEOUS) IMPLANT
GAUZE PETRO XEROFOAM 1X8 (MISCELLANEOUS) ×3 IMPLANT
GAUZE SPONGE 4X4 12PLY STRL (GAUZE/BANDAGES/DRESSINGS) ×3 IMPLANT
GLOVE BIOGEL PI IND STRL 9 (GLOVE) ×1 IMPLANT
GLOVE BIOGEL PI INDICATOR 9 (GLOVE) ×2
GLOVE SURG ORTHO 9.0 STRL STRW (GLOVE) ×3 IMPLANT
GOWN SRG 2XL LVL 4 RGLN SLV (GOWNS) ×1 IMPLANT
GOWN STRL NON-REIN 2XL LVL4 (GOWNS) ×2
GOWN STRL REUS W/ TWL LRG LVL3 (GOWN DISPOSABLE) ×1 IMPLANT
GOWN STRL REUS W/TWL 2XL LVL3 (GOWN DISPOSABLE) ×3 IMPLANT
GOWN STRL REUS W/TWL LRG LVL3 (GOWN DISPOSABLE) ×2
KIT RM TURNOVER STRD PROC AR (KITS) ×3 IMPLANT
NS IRRIG 500ML POUR BTL (IV SOLUTION) ×3 IMPLANT
PACK EXTREMITY ARMC (MISCELLANEOUS) ×3 IMPLANT
PAD CAST CTTN 4X4 STRL (SOFTGOODS) ×1 IMPLANT
PADDING CAST COTTON 4X4 STRL (SOFTGOODS) ×2
STOCKINETTE STRL 4IN 9604848 (GAUZE/BANDAGES/DRESSINGS) ×3 IMPLANT
SUT ETHILON 4-0 (SUTURE) ×2
SUT ETHILON 4-0 FS2 18XMFL BLK (SUTURE) ×1
SUTURE ETHLN 4-0 FS2 18XMF BLK (SUTURE) ×1 IMPLANT

## 2016-06-29 NOTE — Transfer of Care (Signed)
Immediate Anesthesia Transfer of Care Note  Patient: Ana Clark  Procedure(s) Performed: Procedure(s): CARPAL TUNNEL RELEASE (Right)  Patient Location: PACU  Anesthesia Type:General  Level of Consciousness: awake, alert  and oriented  Airway & Oxygen Therapy: Patient Spontanous Breathing  Post-op Assessment: Report given to RN and Post -op Vital signs reviewed and stable  Post vital signs: Reviewed and stable  Last Vitals:  Filed Vitals:   06/29/16 0737 06/29/16 0942  BP: 129/79 131/89  Pulse: 63 72  Temp: 36.8 C 36.3 C  Resp: 18 15    Last Pain:  Filed Vitals:   06/29/16 0947  PainSc: 0-No pain         Complications: No apparent anesthesia complications

## 2016-06-29 NOTE — Discharge Instructions (Addendum)
Loosen Ace wrap prior to discharge and if fingers swell. Keep dressing clean and dry. Work fingers is much as possible.  AMBULATORY SURGERY  DISCHARGE INSTRUCTIONS   1) The drugs that you were given will stay in your system until tomorrow so for the next 24 hours you should not:  A) Drive an automobile B) Make any legal decisions C) Drink any alcoholic beverage   2) You may resume regular meals tomorrow.  Today it is better to start with liquids and gradually work up to solid foods.  You may eat anything you prefer, but it is better to start with liquids, then soup and crackers, and gradually work up to solid foods.   3) Please notify your doctor immediately if you have any unusual bleeding, trouble breathing, redness and pain at the surgery site, drainage, fever, or pain not relieved by medication.    4) Additional Instructions:        Please contact your physician with any problems or Same Day Surgery at [phone], Monday through Friday 6 am to 4 pm, or [place] at [facility] number at [phone].

## 2016-06-29 NOTE — H&P (Signed)
Subjective:   Patient is a 55 y.o. female presents with right hand numbnes. Onset of symptoms was gradual starting a few years ago with rapidly worsening course since that time. The pain is located right hand. Patient describes the pain as moderate to severe  nearly constant and rated as moderate and severe. Pain has been associated with numbness.  Previous studies include EMG/NCV showing carpal tunnel syndrome.  There are no active problems to display for this patient.  Past Medical History  Diagnosis Date  . Anxiety   . Depression   . History of panic attacks   . Headache   . Nonalcoholic fatty liver disease     Past Surgical History  Procedure Laterality Date  . Abdominal hysterectomy      Prescriptions prior to admission  Medication Sig Dispense Refill Last Dose  . aspirin 81 MG tablet Take 81 mg by mouth daily.   06/08/2016  . Naproxen Sodium (ALEVE PO) Take 1 tablet by mouth as needed.    06/08/2016  . amoxicillin-clavulanate (AUGMENTIN) 875-125 MG tablet Take 1 tablet by mouth every 12 (twelve) hours. (Patient not taking: Reported on 06/15/2016) 20 tablet 0 Not Taking at Unknown time  . fluticasone (FLONASE) 50 MCG/ACT nasal spray Place 1 spray into both nostrils 2 (two) times daily. (Patient not taking: Reported on 06/15/2016) 16 g 0 Not Taking at Unknown time  . ibuprofen (ADVIL,MOTRIN) 800 MG tablet Take 1 tablet (800 mg total) by mouth 3 (three) times daily. (Patient not taking: Reported on 06/15/2016) 30 tablet 0 Not Taking at Unknown time  . sodium chloride (OCEAN) 0.65 % SOLN nasal spray Place 2 sprays into both nostrils every 2 (two) hours while awake. (Patient not taking: Reported on 06/15/2016)  0 Not Taking at Unknown time   Allergies  Allergen Reactions  . Septra [Sulfamethoxazole-Trimethoprim] Nausea And Vomiting    Social History  Substance Use Topics  . Smoking status: Current Every Day Smoker -- 0.50 packs/day    Types: Cigarettes  . Smokeless tobacco: Never Used   . Alcohol Use: No    History reviewed. No pertinent family history.  Review of Systems Pertinent items are noted in HPI.  Objective:   Patient Vitals for the past 8 hrs:  BP Temp Temp src Pulse Resp SpO2  06/29/16 0737 129/79 mmHg 98.3 F (36.8 C) Oral 63 18 96 %          BP 129/79 mmHg  Pulse 63  Temp(Src) 98.3 F (36.8 C) (Oral)  Resp 18  SpO2 96% General appearance: alert and cooperative Head: Normocephalic, without obvious abnormality, atraumatic Lungs: clear to auscultation bilaterally Heart: regular rate and rhythm, S1, S2 normal, no murmur, click, rub or gallop Extremities: mild thenar atrophy right hand with decreased sensation    Assessment:   Active Problems:   * No active hospital problems. *   Plan:   Right carpal tunnel release

## 2016-06-29 NOTE — Anesthesia Preprocedure Evaluation (Signed)
Anesthesia Evaluation  Patient identified by MRN, date of birth, ID band Patient awake    Reviewed: Allergy & Precautions, H&P , NPO status , Patient's Chart, lab work & pertinent test results, reviewed documented beta blocker date and time   History of Anesthesia Complications (+) PONV and history of anesthetic complications  Airway Mallampati: III  TM Distance: >3 FB Neck ROM: full    Dental no notable dental hx. (+) Missing, Poor Dentition   Pulmonary neg shortness of breath, neg sleep apnea, neg COPD, Recent URI , Residual Cough, Current Smoker,    Pulmonary exam normal breath sounds clear to auscultation       Cardiovascular Exercise Tolerance: Good negative cardio ROS Normal cardiovascular exam Rhythm:regular Rate:Normal     Neuro/Psych PSYCHIATRIC DISORDERS (Depression and anxiety) negative neurological ROS     GI/Hepatic negative GI ROS, NAFLD   Endo/Other  negative endocrine ROS  Renal/GU negative Renal ROS  negative genitourinary   Musculoskeletal   Abdominal   Peds  Hematology negative hematology ROS (+)   Anesthesia Other Findings Past Medical History:   Anxiety                                                      Depression                                                   History of panic attacks                                     Headache                                                     Nonalcoholic fatty liver disease                             Reproductive/Obstetrics negative OB ROS                             Anesthesia Physical Anesthesia Plan  ASA: II  Anesthesia Plan: General   Post-op Pain Management:    Induction:   Airway Management Planned:   Additional Equipment:   Intra-op Plan:   Post-operative Plan:   Informed Consent: I have reviewed the patients History and Physical, chart, labs and discussed the procedure including the risks,  benefits and alternatives for the proposed anesthesia with the patient or authorized representative who has indicated his/her understanding and acceptance.   Dental Advisory Given  Plan Discussed with: Anesthesiologist, CRNA and Surgeon  Anesthesia Plan Comments:         Anesthesia Quick Evaluation

## 2016-06-29 NOTE — OR Nursing (Signed)
Upon arrival pt ambulated to bathroom to void.  Back to room, right hand elevated on 2 pillows, ice pack under hand/arm.

## 2016-06-29 NOTE — Op Note (Signed)
06/29/2016  9:36 AM  PATIENT:  Ana Clark  55 y.o. female  PRE-OPERATIVE DIAGNOSIS:  CARPAL TUNNEL SYNDROME right  POST-OPERATIVE DIAGNOSIS:  CARPAL TUNNEL SYNDROME right  PROCEDURE:  Procedure(s): CARPAL TUNNEL RELEASE (Right)  SURGEON: Leitha Schuller, MD  ASSISTANTS: None  ANESTHESIA:   general  EBL:     BLOOD ADMINISTERED:none  DRAINS: none   LOCAL MEDICATIONS USED:  MARCAINE     SPECIMEN:  No Specimen  DISPOSITION OF SPECIMEN:  N/A  COUNTS:  YES  TOURNIQUET:   9 minutes at 250 mmHg  IMPLANTS: None  DICTATION: .Dragon Dictation patient was brought the operating room and after adequate anesthesia was obtained the right arm was prepped and draped in usual sterile fashion. After patient identification and timeout procedures were completed, tourniquet was raised. An incision approximately to half centimeters long was made and line with ring metacarpal. Subcutaneous tissues tissue was spread and there was aberrant thenar musculature covering the transverse carpal ligament. This was elevated and the transcarpal ligament incised. A vascular hemostat was placed underneath the ligament to protect the underlying structures releases carried out distally until fat was noted around the nerve and then proximally to the  level of the wrist flexion crease where there appeared to be compression of the proximal canal and good vascular blush after this release is felt that the pathology had been addressed. The wound was then irrigated and closed with simple and opted 5-0 nylon after infiltrating 10 cc of half percent Sensorcaine and the subcutaneous tissues tissue for postop analgesia Xeroform 4 x 4 web roll and Ace wrap applied  PLAN OF CARE: Discharge to home after PACU  PATIENT DISPOSITION:  PACU - hemodynamically stable.

## 2016-06-29 NOTE — Anesthesia Postprocedure Evaluation (Signed)
Anesthesia Post Note  Patient: Ana Clark  Procedure(s) Performed: Procedure(s) (LRB): CARPAL TUNNEL RELEASE (Right)  Patient location during evaluation: PACU Anesthesia Type: General Level of consciousness: awake and alert Pain management: pain level controlled Vital Signs Assessment: post-procedure vital signs reviewed and stable Respiratory status: spontaneous breathing, nonlabored ventilation, respiratory function stable and patient connected to nasal cannula oxygen Cardiovascular status: blood pressure returned to baseline and stable Postop Assessment: no signs of nausea or vomiting Anesthetic complications: no    Last Vitals:  Filed Vitals:   06/29/16 1100 06/29/16 1129  BP: 151/86 143/80  Pulse: 56 56  Temp: 36.7 C   Resp: 16 16    Last Pain:  Filed Vitals:   06/29/16 1130  PainSc: 0-No pain                 Lenard Simmer

## 2016-06-29 NOTE — Anesthesia Procedure Notes (Signed)
Procedure Name: LMA Insertion Date/Time: 06/29/2016 9:05 AM Performed by: Michaele Offer Pre-anesthesia Checklist: Patient identified, Emergency Drugs available, Suction available, Patient being monitored and Timeout performed Patient Re-evaluated:Patient Re-evaluated prior to inductionOxygen Delivery Method: Circle system utilized Preoxygenation: Pre-oxygenation with 100% oxygen Intubation Type: IV induction Ventilation: Mask ventilation without difficulty Number of attempts: 1 Placement Confirmation: positive ETCO2 and breath sounds checked- equal and bilateral Tube secured with: Tape Dental Injury: Teeth and Oropharynx as per pre-operative assessment

## 2016-06-29 NOTE — H&P (Signed)
Reviewed paper H+P, will be scanned into chart. No changes noted.  

## 2016-11-07 ENCOUNTER — Ambulatory Visit (INDEPENDENT_AMBULATORY_CARE_PROVIDER_SITE_OTHER): Payer: 59

## 2016-11-07 ENCOUNTER — Ambulatory Visit
Admission: EM | Admit: 2016-11-07 | Discharge: 2016-11-07 | Disposition: A | Discharge status: Home / Self Care | Payer: 59 | Attending: Family Medicine | Admitting: Family Medicine

## 2016-11-07 DIAGNOSIS — J069 Acute upper respiratory infection, unspecified: Secondary | ICD-10-CM

## 2016-11-07 MED ORDER — HYDROCOD POLST-CPM POLST ER 10-8 MG/5ML PO SUER: 5.0000 mL | Freq: Two times a day (BID) | ORAL | 0 refills | Status: DC

## 2016-11-07 MED ORDER — FLUTICASONE PROPIONATE 50 MCG/ACT NA SUSP: 2.0000 | Freq: Every day | NASAL | 0 refills | Status: DC

## 2016-11-07 NOTE — ED Triage Notes (Signed)
Pt c/o coughing and tightness in her chest to were its difficult to take a deep breath.

## 2016-11-07 NOTE — ED Provider Notes (Signed)
CSN: [id number]     Arrival date & time 11/07/16  0805 History   First MD Initiated Contact with Patient 11/07/16 [phone]     Chief Complaint  Patient presents with  . Cough   (Consider location/radiation/quality/duration/timing/severity/associated sxs/prior Treatment) HPI  55 year old female who presents with a three-day history of coughing and check tightness in her chest and difficulty breathing when lying recumbent. He smokes a 1/2 pack of cigarettes per day. Consider cough is much worse when she lies down at nighttime. The daytime most her coughing is nonproductive.        Past Medical History:  Diagnosis Date  . Anxiety   . Depression   . Headache   . History of panic attacks   . Nonalcoholic fatty liver disease    Past Surgical History:  Procedure Laterality Date  . ABDOMINAL HYSTERECTOMY    . CARPAL TUNNEL RELEASE Right 06/29/2016   Procedure: CARPAL TUNNEL RELEASE;  Surgeon: Kennedy Bucker, MD;  Location: [facility] ORS;  Service: Orthopedics;  Laterality: Right;   Family History  Problem Relation Age of Onset  . Diabetes Mother   . Hypertension Mother    Social History  Substance Use Topics  . Smoking status: Current Every Day Smoker    Packs/day: 0.50    Types: Cigarettes  . Smokeless tobacco: Never Used  . Alcohol use No   OB History    No data available     Review of Systems  Constitutional: Positive for activity change. Negative for chills, fatigue and fever.  HENT: Positive for congestion, ear pain, postnasal drip, rhinorrhea, sinus pain and sinus pressure.   Respiratory: Positive for cough and shortness of breath. Negative for wheezing and stridor.   All other systems reviewed and are negative.   Allergies  Septra [sulfamethoxazole-trimethoprim]  Home Medications   Prior to Admission medications   Medication Sig Start Date End Date Taking? Authorizing Provider  chlorpheniramine-HYDROcodone (TUSSIONEX PENNKINETIC ER) 10-8 MG/5ML SUER Take 5 mLs by  mouth 2 (two) times daily. 11/07/16   Lutricia Feil, PA-C  fluticasone (FLONASE) 50 MCG/ACT nasal spray Place 2 sprays into both nostrils daily. 11/07/16   Lutricia Feil, PA-C   Meds Ordered and Administered this Visit  Medications - No data to display  BP (!) 155/90 (BP Location: Left Arm)   Pulse 66   Temp 97.7 F (36.5 C) (Oral)   Resp 18   Ht 5\' 1"  (1.549 m)   Wt 172 lb (78 kg)   SpO2 99%   BMI 32.50 kg/m  No data found.   Physical Exam  Constitutional: She is oriented to person, place, and time. She appears well-developed and well-nourished. No distress.  HENT:  Head: Normocephalic and atraumatic.  Right TM is erythematous with sclerosis of the TM. The left TM has a small perforation which the patient knows of and again sclerotic borders.  Pulmonary/Chest: Effort normal. No respiratory distress. She has no wheezes. She has rales.  Musculoskeletal: Normal range of motion.  Neurological: She is alert and oriented to person, place, and time.  Skin: Skin is warm and dry. She is not diaphoretic.  Psychiatric: She has a normal mood and affect. Her behavior is normal. Judgment and thought content normal.  Nursing note and vitals reviewed.   Urgent Care Course   Clinical Course     Procedures (including critical care time)  Labs Review Labs Reviewed - No data to display  Imaging Review Dg Chest 2 View  Result Date: 11/07/2016 CLINICAL  DATA:  Dry cough. EXAM: CHEST  2 VIEW COMPARISON:  No recent prior . FINDINGS: Mediastinum and hilar structures normal. Lungs are clear. Heart size normal. No pleural effusion or pneumothorax. IMPRESSION: No acute abnormality. Electronically Signed   By: Maisie Fus  Register   On: 11/07/2016 08:50     Visual Acuity Review  Right Eye Distance:   Left Eye Distance:   Bilateral Distance:    Right Eye Near:   Left Eye Near:    Bilateral Near:         MDM   1. Acute upper respiratory infection    New Prescriptions    CHLORPHENIRAMINE-HYDROCODONE (TUSSIONEX PENNKINETIC ER) 10-8 MG/5ML SUER    Take 5 mLs by mouth 2 (two) times daily.   FLUTICASONE (FLONASE) 50 MCG/ACT NASAL SPRAY    Place 2 sprays into both nostrils daily.  Plan: 1. Test/x-ray results and diagnosis reviewed with patient 2. rx as per orders; risks, benefits, potential side effects reviewed with patient 3. Recommend supportive treatment with Rest and fluids. Use of Flonase for nasal congestion. She was cautioned not to drive or perform activities requiring concentration and judgment while using the Tussionex cough syrup. She is not improving or worsen she should follow-up with her primary care physician 4. F/u prn if symptoms worsen or don't improve     Lutricia Feil, PA-C 11/07/16 3790

## 2018-02-04 ENCOUNTER — Ambulatory Visit
Admission: EM | Admit: 2018-02-04 | Discharge: 2018-02-04 | Disposition: A | Discharge status: Home / Self Care | Payer: 59 | Attending: Family Medicine | Admitting: Family Medicine

## 2018-02-04 ENCOUNTER — Other Ambulatory Visit: Payer: Self-pay

## 2018-02-04 ENCOUNTER — Encounter: Payer: Self-pay | Admitting: Emergency Medicine

## 2018-02-04 DIAGNOSIS — R05 Cough: Secondary | ICD-10-CM | POA: Diagnosis not present

## 2018-02-04 DIAGNOSIS — J40 Bronchitis, not specified as acute or chronic: Secondary | ICD-10-CM

## 2018-02-04 DIAGNOSIS — R059 Cough, unspecified: Secondary | ICD-10-CM

## 2018-02-04 MED ORDER — BENZONATATE 200 MG PO CAPS: 200.0000 mg | ORAL_CAPSULE | Freq: Three times a day (TID) | ORAL | 0 refills | Status: DC | PRN

## 2018-02-04 MED ORDER — AZITHROMYCIN 250 MG PO TABS: ORAL_TABLET | ORAL | 0 refills | Status: DC

## 2018-02-04 NOTE — ED Provider Notes (Signed)
[facility]    CSN: [id number] Arrival date & time: 02/04/18  1112     History   Chief Complaint Chief Complaint  Patient presents with  . Cough    HPI Ana Clark is a 57 y.o. female.   The history is provided by the patient.  Cough  Associated symptoms: no headaches and no wheezing   URI  Presenting symptoms: congestion, cough and fatigue   Severity:  Moderate Onset quality:  Sudden Timing:  Constant Progression:  Worsening Chronicity:  New Relieved by:  Nothing Ineffective treatments:  OTC medications Associated symptoms: no headaches, no sinus pain and no wheezing   Risk factors: sick contacts   Risk factors: not elderly, no chronic cardiac disease, no chronic kidney disease, no chronic respiratory disease, no diabetes mellitus, no immunosuppression, no recent illness and no recent travel     Past Medical History:  Diagnosis Date  . Anxiety   . Depression   . Headache   . History of panic attacks   . Nonalcoholic fatty liver disease     There are no active problems to display for this patient.   Past Surgical History:  Procedure Laterality Date  . ABDOMINAL HYSTERECTOMY    . CARPAL TUNNEL RELEASE Right 06/29/2016   Procedure: CARPAL TUNNEL RELEASE;  Surgeon: Kennedy Bucker, MD;  Location: [facility] ORS;  Service: Orthopedics;  Laterality: Right;    OB History    No data available       Home Medications    Prior to Admission medications   Medication Sig Start Date End Date Taking? Authorizing Provider  azithromycin (ZITHROMAX Z-PAK) 250 MG tablet 2 tabs po once day 1, then 1 tab po qd for next 4 days 02/04/18   Payton Mccallum, MD  benzonatate (TESSALON) 200 MG capsule Take 1 capsule (200 mg total) by mouth 3 (three) times daily as needed. 02/04/18   Payton Mccallum, MD  chlorpheniramine-HYDROcodone (TUSSIONEX PENNKINETIC ER) 10-8 MG/5ML SUER Take 5 mLs by mouth 2 (two) times daily. 11/07/16   Lutricia Feil, PA-C  fluticasone (FLONASE) 50  MCG/ACT nasal spray Place 2 sprays into both nostrils daily. 11/07/16   Lutricia Feil, PA-C    Family History Family History  Problem Relation Age of Onset  . Diabetes Mother   . Hypertension Mother     Social History Social History   Tobacco Use  . Smoking status: Current Every Day Smoker    Packs/day: 0.50    Types: Cigarettes  . Smokeless tobacco: Never Used  Substance Use Topics  . Alcohol use: No  . Drug use: No     Allergies   Septra [sulfamethoxazole-trimethoprim]   Review of Systems Review of Systems  Constitutional: Positive for fatigue.  HENT: Positive for congestion. Negative for sinus pain.   Respiratory: Positive for cough. Negative for wheezing.   Neurological: Negative for headaches.     Physical Exam Triage Vital Signs ED Triage Vitals  Enc Vitals Group     BP 02/04/18 1223 107/70     Pulse Rate 02/04/18 1223 66     Resp 02/04/18 1223 16     Temp 02/04/18 1223 98 F (36.7 C)     Temp Source 02/04/18 1223 Oral     SpO2 02/04/18 1223 98 %     Weight 02/04/18 1220 172 lb (78 kg)     Height 02/04/18 1220 5\' 1"  (1.549 m)     Head Circumference --      Peak Flow --  Pain Score 02/04/18 1220 0     Pain Loc --      Pain Edu? --      Excl. in GC? --    No data found.  Updated Vital Signs BP 107/70 (BP Location: Left Arm)   Pulse 66   Temp 98 F (36.7 C) (Oral)   Resp 16   Ht 5\' 1"  (1.549 m)   Wt 172 lb (78 kg)   SpO2 98%   BMI 32.50 kg/m   Visual Acuity Right Eye Distance:   Left Eye Distance:   Bilateral Distance:    Right Eye Near:   Left Eye Near:    Bilateral Near:     Physical Exam  Constitutional: She appears well-developed and well-nourished. No distress.  HENT:  Head: Normocephalic and atraumatic.  Right Ear: Tympanic membrane, external ear and ear canal normal.  Left Ear: Tympanic membrane, external ear and ear canal normal.  Nose: No mucosal edema, rhinorrhea, nose lacerations, sinus tenderness, nasal  deformity, septal deviation or nasal septal hematoma. No epistaxis.  No foreign bodies. Right sinus exhibits no maxillary sinus tenderness and no frontal sinus tenderness. Left sinus exhibits no maxillary sinus tenderness and no frontal sinus tenderness.  Mouth/Throat: Uvula is midline, oropharynx is clear and moist and mucous membranes are normal. No oropharyngeal exudate.  Eyes: Conjunctivae and EOM are normal. Pupils are equal, round, and reactive to light. Right eye exhibits no discharge. Left eye exhibits no discharge. No scleral icterus.  Neck: Normal range of motion. Neck supple. No thyromegaly present.  Cardiovascular: Normal rate, regular rhythm and normal heart sounds.  Pulmonary/Chest: Effort normal. No stridor. No respiratory distress. She has no wheezes. She has no rales.  rhonchi  Lymphadenopathy:    She has no cervical adenopathy.  Skin: She is not diaphoretic.  Nursing note and vitals reviewed.    UC Treatments / Results  Labs (all labs ordered are listed, but only abnormal results are displayed) Labs Reviewed - No data to display  EKG  EKG Interpretation None       Radiology No results found.  Procedures Procedures (including critical care time)  Medications Ordered in UC Medications - No data to display   Initial Impression / Assessment and Plan / UC Course  I have reviewed the triage vital signs and the nursing notes.  Pertinent labs & imaging results that were available during my care of the patient were reviewed by me and considered in my medical decision making (see chart for details).     Final Clinical Impressions(s) / UC Diagnoses   Final diagnoses:  Cough  Bronchitis    ED Discharge Orders        Ordered    azithromycin (ZITHROMAX Z-PAK) 250 MG tablet     02/04/18 1239    benzonatate (TESSALON) 200 MG capsule  3 times daily PRN     02/04/18 1239     1. diagnosis reviewed with patient 2. rx as per orders above; reviewed possible side  effects, interactions, risks and benefits  3. Recommend supportive treatment with rest, fluids  4. Follow-up prn if symptoms worsen or don't improve  Controlled Substance Prescriptions [place] Controlled Substance Registry consulted? Not Applicable   02/06/18, MD 02/04/18 1254

## 2018-02-04 NOTE — ED Triage Notes (Signed)
Patient c/o cough and chest congestion for a week.  Patient denies fevers.  °

## 2018-10-22 ENCOUNTER — Other Ambulatory Visit: Payer: Self-pay

## 2018-10-22 ENCOUNTER — Ambulatory Visit
Admission: EM | Admit: 2018-10-22 | Discharge: 2018-10-22 | Disposition: A | Discharge status: Home / Self Care | Payer: 59 | Attending: Family Medicine | Admitting: Family Medicine

## 2018-10-22 ENCOUNTER — Encounter: Payer: Self-pay | Admitting: Emergency Medicine

## 2018-10-22 DIAGNOSIS — F419 Anxiety disorder, unspecified: Secondary | ICD-10-CM | POA: Diagnosis not present

## 2018-10-22 DIAGNOSIS — Z90710 Acquired absence of both cervix and uterus: Secondary | ICD-10-CM | POA: Insufficient documentation

## 2018-10-22 DIAGNOSIS — Z8249 Family history of ischemic heart disease and other diseases of the circulatory system: Secondary | ICD-10-CM | POA: Insufficient documentation

## 2018-10-22 DIAGNOSIS — Z833 Family history of diabetes mellitus: Secondary | ICD-10-CM | POA: Insufficient documentation

## 2018-10-22 DIAGNOSIS — Z7982 Long term (current) use of aspirin: Secondary | ICD-10-CM | POA: Insufficient documentation

## 2018-10-22 DIAGNOSIS — R0602 Shortness of breath: Secondary | ICD-10-CM | POA: Diagnosis not present

## 2018-10-22 DIAGNOSIS — F17210 Nicotine dependence, cigarettes, uncomplicated: Secondary | ICD-10-CM | POA: Diagnosis not present

## 2018-10-22 DIAGNOSIS — Z79899 Other long term (current) drug therapy: Secondary | ICD-10-CM | POA: Diagnosis not present

## 2018-10-22 DIAGNOSIS — M069 Rheumatoid arthritis, unspecified: Secondary | ICD-10-CM | POA: Insufficient documentation

## 2018-10-22 DIAGNOSIS — R5383 Other fatigue: Secondary | ICD-10-CM | POA: Insufficient documentation

## 2018-10-22 DIAGNOSIS — Z882 Allergy status to sulfonamides status: Secondary | ICD-10-CM | POA: Insufficient documentation

## 2018-10-22 HISTORY — DX: Rheumatoid arthritis, unspecified: M06.9

## 2018-10-22 MED ORDER — ALBUTEROL SULFATE HFA 108 (90 BASE) MCG/ACT IN AERS
1.0000 | INHALATION_SPRAY | Freq: Four times a day (QID) | RESPIRATORY_TRACT | 0 refills | Status: DC | PRN
Start: 2018-10-22 — End: 2024-01-13

## 2018-10-22 NOTE — Discharge Instructions (Signed)
Rest.  Stop smoking.  Inhaler as prescribed.  Take care  Dr. Adriana Simas

## 2018-10-22 NOTE — ED Triage Notes (Signed)
Patient c/o fatigue and shortness of breath that started yesterday. Patient denies fever. Patient states she has been sweating at different times of the day. Denies chest pain.

## 2018-10-22 NOTE — ED Provider Notes (Addendum)
[facility]    CSN: [id number] Arrival date & time: 10/22/18  1650  History   Chief Complaint Chief Complaint  Patient presents with  . Fatigue  . Shortness of Breath   HPI  57 year old female presents with fatigue and shortness of breath.  Started yesterday.  Patient reports fatigue, sweating, and shortness of breath.  No reports of chest pain.  Patient states that when she gets short of breath she gets anxious.  No fever.  Patient does note some runny nose.  Mild cough.  No known exacerbating factors.  Patient does endorse recent sick contacts.  No other reported symptoms.  No other complaints.  Of note, patient continues to smoke.  Past Medical History:  Diagnosis Date  . Anxiety   . Depression   . Headache   . History of panic attacks   . Nonalcoholic fatty liver disease   . Rheumatoid arthritis [facility])    Past Surgical History:  Procedure Laterality Date  . ABDOMINAL HYSTERECTOMY    . CARPAL TUNNEL RELEASE Right 06/29/2016   Procedure: CARPAL TUNNEL RELEASE;  Surgeon: Kennedy Bucker, MD;  Location: [facility] ORS;  Service: Orthopedics;  Laterality: Right;   OB History   None    Home Medications    Prior to Admission medications   Medication Sig Start Date End Date Taking? Authorizing Provider  albuterol (PROVENTIL HFA;VENTOLIN HFA) 108 (90 Base) MCG/ACT inhaler Inhale 1-2 puffs into the lungs every 6 (six) hours as needed for wheezing or shortness of breath. 10/22/18   Tommie Sams, DO  aspirin EC 81 MG tablet Take by mouth.    [provider]  folic acid (FOLVITE) 1 MG tablet  10/03/18   [provider]  methotrexate (RHEUMATREX) 2.5 MG tablet TK 8 TS PO ONCE Q 7 DAYS 10/03/18   [provider]   Family History Family History  Problem Relation Age of Onset  . Diabetes Mother   . Hypertension Mother    Social History Social History   Tobacco Use  . Smoking status: Current Every Day Smoker    Packs/day: 0.50    Types:  Cigarettes  . Smokeless tobacco: Never Used  Substance Use Topics  . Alcohol use: No  . Drug use: No   Allergies   Septra [sulfamethoxazole-trimethoprim]  Review of Systems Review of Systems  Constitutional: Negative for fever.  HENT: Positive for rhinorrhea.   Respiratory: Positive for cough and shortness of breath.    Physical Exam Triage Vital Signs ED Triage Vitals [10/22/18 1659]  Enc Vitals Group     BP 122/79     Pulse Rate 86     Resp 18     Temp 98.1 F (36.7 C)     Temp Source Oral     SpO2 98 %     Weight 175 lb (79.4 kg)     Height 5\' 1"  (1.549 m)     Head Circumference      Peak Flow      Pain Score 0     Pain Loc      Pain Edu?      Excl. in GC?    Updated Vital Signs BP 122/79 (BP Location: Left Arm)   Pulse 86   Temp 98.1 F (36.7 C) (Oral)   Resp 18   Ht 5\' 1"  (1.549 m)   Wt 79.4 kg   SpO2 98%   BMI 33.07 kg/m   Visual Acuity Right Eye Distance:   Left Eye  Distance:   Bilateral Distance:    Right Eye Near:   Left Eye Near:    Bilateral Near:     Physical Exam  Constitutional: She is oriented to person, place, and time. She appears well-developed. No distress.  HENT:  Head: Normocephalic and atraumatic.  Eyes: Conjunctivae are normal. Right eye exhibits no discharge. Left eye exhibits no discharge.  Cardiovascular: Normal rate and regular rhythm.  Pulmonary/Chest: Effort normal and breath sounds normal. She has no wheezes. She has no rales.  Neurological: She is alert and oriented to person, place, and time.  Psychiatric: Her behavior is normal.  Flat affect.  Nursing note and vitals reviewed.  UC Treatments / Results  Labs (all labs ordered are listed, but only abnormal results are displayed) Labs Reviewed - No data to display  EKG Interpretation: Normal sinus rhythm with a rate of 79.  Normal axis.  Normal intervals.  No ST-T wave changes.  Normal EKG.  Radiology No results found.  Procedures Procedures (including  critical care time)  Medications Ordered in UC Medications - No data to display  Initial Impression / Assessment and Plan / UC Course  I have reviewed the triage vital signs and the nursing notes.  Pertinent labs & imaging results that were available during my care of the patient were reviewed by me and considered in my medical decision making (see chart for details).    57 year old female presents with SOB.  This is likely secondary to beginnings of viral respiratory illness as well as underlying anxiety.  Her exam is essentially normal.  Albuterol as needed.  Stop smoking.  Final Clinical Impressions(s) / UC Diagnoses   Final diagnoses:  SOB (shortness of breath)     Discharge Instructions     Rest.  Stop smoking.  Inhaler as prescribed.  Take care  Dr. Adriana Simas    ED Prescriptions    Medication Sig Dispense Auth. Provider   albuterol (PROVENTIL HFA;VENTOLIN HFA) 108 (90 Base) MCG/ACT inhaler Inhale 1-2 puffs into the lungs every 6 (six) hours as needed for wheezing or shortness of breath. 1 Inhaler Tommie Sams, DO     Controlled Substance Prescriptions [place] Controlled Substance Registry consulted? Not Applicable   Tommie Sams, DO 10/22/18 1732    Tommie Sams, DO 10/22/18 1735

## 2019-01-14 ENCOUNTER — Encounter: Payer: Self-pay | Admitting: Emergency Medicine

## 2019-01-14 ENCOUNTER — Ambulatory Visit
Admission: EM | Admit: 2019-01-14 | Discharge: 2019-01-14 | Disposition: A | Discharge status: Home / Self Care | Payer: 59 | Attending: Family Medicine | Admitting: Family Medicine

## 2019-01-14 ENCOUNTER — Other Ambulatory Visit: Payer: Self-pay

## 2019-01-14 DIAGNOSIS — F430 Acute stress reaction: Secondary | ICD-10-CM | POA: Diagnosis present

## 2019-01-14 DIAGNOSIS — F410 Panic disorder [episodic paroxysmal anxiety] without agoraphobia: Secondary | ICD-10-CM

## 2019-01-14 DIAGNOSIS — R12 Heartburn: Secondary | ICD-10-CM

## 2019-01-14 DIAGNOSIS — R079 Chest pain, unspecified: Secondary | ICD-10-CM

## 2019-01-14 NOTE — Discharge Instructions (Addendum)
If your pain returns and not subside in 5 minutes call 911 and go to the emergency room.

## 2019-01-14 NOTE — ED Triage Notes (Signed)
Patient in today c/o chest pain x 10-15 minutes. Patient states her chest feels tight now, but had 2 sharp pains when it started. Patient states she started sweating. Patient denies vomiting. Patient denies any heart disease except for hyperlipidemia which she controls with diet.

## 2019-01-14 NOTE — ED Provider Notes (Signed)
[facility]    CSN: [id number] Arrival date & time: 01/14/19  1530     History   Chief Complaint Chief Complaint  Patient presents with  . Chest Pain    HPI Cristine PolioRose U Morriss is a 58 y.o. female.   HPI  58 year old female today with chest pain that she had for approximately 10 to 15 minutes.  Was at work today she had a confrontation and became very upset.  On her way home she noticed that she had 2 very sharp pains of her left chest that were very momentary and then abated.  Since that time she has had some pressure in her chest for the last 10 or 15 minutes.  She started sweating not diaphoretic at all now.  No nausea or vomiting.  She has had no radiation of the pain.  No history of heart disease.  No family history of heart disease.  Does have a history of frequent panic attacks particularly when she becomes excited.  Seen for very similar appearance on 10/22/2018 when she had shortness of breath.  States that since she arrived here she feels much better.         Past Medical History:  Diagnosis Date  . Anxiety   . Depression   . Headache   . History of panic attacks   . Nonalcoholic fatty liver disease   . Rheumatoid arthritis (HCC)     There are no active problems to display for this patient.   Past Surgical History:  Procedure Laterality Date  . ABDOMINAL HYSTERECTOMY    . CARPAL TUNNEL RELEASE Right 06/29/2016   Procedure: CARPAL TUNNEL RELEASE;  Surgeon: Kennedy BuckerMichael Menz, MD;  Location: [facility] ORS;  Service: Orthopedics;  Laterality: Right;    OB History   No obstetric history on file.      Home Medications    Prior to Admission medications   Medication Sig Start Date End Date Taking? Authorizing Provider  albuterol (PROVENTIL HFA;VENTOLIN HFA) 108 (90 Base) MCG/ACT inhaler Inhale 1-2 puffs into the lungs every 6 (six) hours as needed for wheezing or shortness of breath. 10/22/18  Yes Everlene Otherook, Jayce G, DO  aspirin EC 81 MG tablet Take by mouth.   Yes  [provider]  folic acid (FOLVITE) 1 MG tablet  10/03/18  Yes [provider]  methotrexate (RHEUMATREX) 2.5 MG tablet TK 8 TS PO ONCE Q 7 DAYS 10/03/18  Yes [provider]    Family History Family History  Problem Relation Age of Onset  . Diabetes Mother   . Hypertension Mother   . Other Father        unknown medical history    Social History Social History   Tobacco Use  . Smoking status: Current Every Day Smoker    Packs/day: 0.50    Types: Cigarettes  . Smokeless tobacco: Never Used  Substance Use Topics  . Alcohol use: No  . Drug use: No     Allergies   Septra [sulfamethoxazole-trimethoprim]   Review of Systems Review of Systems  Constitutional: Positive for activity change and diaphoresis. Negative for appetite change, chills, fatigue and fever.  Respiratory: Positive for chest tightness. Negative for cough, shortness of breath and wheezing.   Cardiovascular: Negative for chest pain.  All other systems reviewed and are negative.    Physical Exam Triage Vital Signs ED Triage Vitals  Enc Vitals Group     BP 01/14/19 1540 (!) 156/84     Pulse Rate 01/14/19  1540 80     Resp 01/14/19 1540 16     Temp 01/14/19 1540 97.9 F (36.6 C)     Temp Source 01/14/19 1540 Oral     SpO2 01/14/19 1540 100 %     Weight 01/14/19 1540 177 lb (80.3 kg)     Height 01/14/19 1540 5\' 1"  (1.549 m)     Head Circumference --      Peak Flow --      Pain Score 01/14/19 1539 6     Pain Loc --      Pain Edu? --      Excl. in GC? --    No data found.  Updated Vital Signs BP (!) 156/84 (BP Location: Right Arm)   Pulse 80   Temp 97.9 F (36.6 C) (Oral)   Resp 16   Ht 5\' 1"  (1.549 m)   Wt 177 lb (80.3 kg)   SpO2 100%   BMI 33.44 kg/m   Visual Acuity Right Eye Distance:   Left Eye Distance:   Bilateral Distance:    Right Eye Near:   Left Eye Near:    Bilateral Near:     Physical Exam Vitals signs and nursing note reviewed.    Constitutional:      General: She is not in acute distress.    Appearance: She is well-developed. She is not ill-appearing, toxic-appearing or diaphoretic.  HENT:     Head: Normocephalic.  Neck:     Musculoskeletal: Normal range of motion and neck supple.  Cardiovascular:     Rate and Rhythm: Normal rate and regular rhythm.     Pulses:          Carotid pulses are 2+ on the right side and 2+ on the left side.    Heart sounds: Normal heart sounds.     Comments: No chest wall tenderness elicited Pulmonary:     Effort: Pulmonary effort is normal.     Breath sounds: Normal breath sounds.  Chest:     Chest wall: No tenderness.  Abdominal:     General: Bowel sounds are normal.     Palpations: Abdomen is soft.  Musculoskeletal: Normal range of motion.     Right lower leg: She exhibits no tenderness.     Left lower leg: She exhibits no tenderness.  Skin:    General: Skin is warm and dry.  Neurological:     General: No focal deficit present.     Mental Status: She is alert.  Psychiatric:        Mood and Affect: Mood normal.        Behavior: Behavior normal.      UC Treatments / Results  Labs (all labs ordered are listed, but only abnormal results are displayed) Labs Reviewed - No data to display  EKG ED ECG REPORT   Date: 01/14/2019  EKG Time: 5:07 PM  Rate: 67  Rhythm: normal sinus rhythm,  unchanged from previous tracings 22 October 2018  Axis normal  Intervals:none  ST&T Change: None acute  Narrative Interpretation sinus rhythm without acute changes                Radiology No results found.  Procedures Procedures (including critical care time)  Medications Ordered in UC Medications - No data to display  Initial Impression / Assessment and Plan / UC Course  I have reviewed the triage vital signs and the nursing notes.  Pertinent labs & imaging results that were available during my  care of the patient were reviewed by me and considered in my medical  decision making (see chart for details).   Patient improved as she stayed in our clinic.  She did receive results from the GI cocktail.  She states that the chest tightness had subsided she has had no further sharp pains in her left anterior chest.  Told the patient that her physical exam and her EKG were both reassuring that this does not appear to be her heart.  However if she developed any pains again tonight she should call 911 and go to the emergency room.   Final Clinical Impressions(s) / UC Diagnoses   Final diagnoses:  Panic attack as reaction to stress  Heartburn     Discharge Instructions     If your pain returns and not subside in 5 minutes call 911 and go to the emergency room.    ED Prescriptions    None     Controlled Substance Prescriptions [place] Controlled Substance Registry consulted? Not Applicable   Lutricia FeilRoemer,  P, PA-C 01/14/19 1719

## 2021-11-06 ENCOUNTER — Ambulatory Visit
Admission: RE | Admit: 2021-11-06 | Discharge: 2021-11-06 | Disposition: A | Discharge status: Home / Self Care | Payer: 59 | Source: Ambulatory Visit | Attending: Emergency Medicine | Admitting: Emergency Medicine

## 2021-11-06 ENCOUNTER — Other Ambulatory Visit: Payer: Self-pay

## 2021-11-06 VITALS — BP 130/73 | HR 73 | Temp 98.6°F | Resp 14 | Ht 61.0 in | Wt 177.0 lb

## 2021-11-06 DIAGNOSIS — J069 Acute upper respiratory infection, unspecified: Secondary | ICD-10-CM

## 2021-11-06 LAB — GROUP A STREP BY PCR: Group A Strep by PCR: NOT DETECTED

## 2021-11-06 MED ORDER — BENZONATATE 100 MG PO CAPS: 200.0000 mg | ORAL_CAPSULE | Freq: Three times a day (TID) | ORAL | 0 refills | Status: DC

## 2021-11-06 MED ORDER — PROMETHAZINE-DM 6.25-15 MG/5ML PO SYRP: 5.0000 mL | ORAL_SOLUTION | Freq: Four times a day (QID) | ORAL | 0 refills | Status: DC | PRN

## 2021-11-06 MED ORDER — IPRATROPIUM BROMIDE 0.06 % NA SOLN: 2.0000 | Freq: Four times a day (QID) | NASAL | 12 refills | Status: DC

## 2021-11-06 NOTE — Discharge Instructions (Signed)

## 2021-11-06 NOTE — ED Provider Notes (Signed)
[facility]    CSN: [id number] Arrival date & time: 11/06/21  1245      History   Chief Complaint Chief Complaint  Patient presents with   Appointment   Sore Throat    HPI Ana Clark is a 60 y.o. female.   HPI  60 year old female here for evaluation of respiratory complaints.  Patient reports that she has been experiencing runny nose and nasal congestion with clear nasal discharge for the past 2 days.  She has had a nonproductive cough, ear pressure, body aches, and sore throat that started today.  She denies any shortness of breath or wheezing, headache, or GI symptoms.  She was recently around her grandson who is positive for RSV.  Past Medical History:  Diagnosis Date   Anxiety    Depression    Headache    History of panic attacks    Nonalcoholic fatty liver disease    Rheumatoid arthritis (HCC)     There are no problems to display for this patient.   Past Surgical History:  Procedure Laterality Date   ABDOMINAL HYSTERECTOMY     CARPAL TUNNEL RELEASE Right 06/29/2016   Procedure: CARPAL TUNNEL RELEASE;  Surgeon: Kennedy Bucker, MD;  Location: [facility] ORS;  Service: Orthopedics;  Laterality: Right;    OB History   No obstetric history on file.      Home Medications    Prior to Admission medications   Medication Sig Start Date End Date Taking? Authorizing Provider  benzonatate (TESSALON) 100 MG capsule Take 2 capsules (200 mg total) by mouth every 8 (eight) hours. 11/06/21  Yes Becky Augusta, NP  folic acid (FOLVITE) 1 MG tablet  10/03/18  Yes [provider]  ipratropium (ATROVENT) 0.06 % nasal spray Place 2 sprays into both nostrils 4 (four) times daily. 11/06/21  Yes Becky Augusta, NP  methotrexate (RHEUMATREX) 2.5 MG tablet TK 8 TS PO ONCE Q 7 DAYS 10/03/18  Yes [provider]  promethazine-dextromethorphan (PROMETHAZINE-DM) 6.25-15 MG/5ML syrup Take 5 mLs by mouth 4 (four) times daily as needed. 11/06/21  Yes Becky Augusta,  NP  albuterol (PROVENTIL HFA;VENTOLIN HFA) 108 (90 Base) MCG/ACT inhaler Inhale 1-2 puffs into the lungs every 6 (six) hours as needed for wheezing or shortness of breath. 10/22/18   Tommie Sams, DO    Family History Family History  Problem Relation Age of Onset   Diabetes Mother    Hypertension Mother    Other Father        unknown medical history    Social History Social History   Tobacco Use   Smoking status: Every Day    Packs/day: 0.50    Types: Cigarettes   Smokeless tobacco: Never  Vaping Use   Vaping Use: Never used  Substance Use Topics   Alcohol use: No   Drug use: No     Allergies   Septra [sulfamethoxazole-trimethoprim]   Review of Systems Review of Systems  Constitutional:  Negative for activity change, appetite change and fever.  HENT:  Positive for congestion, ear pain, rhinorrhea and sore throat.   Respiratory:  Positive for cough. Negative for shortness of breath and wheezing.   Gastrointestinal:  Negative for diarrhea, nausea and vomiting.  Musculoskeletal:  Positive for arthralgias and myalgias.  Skin:  Negative for rash.  Neurological:  Negative for headaches.  Hematological: Negative.   Psychiatric/Behavioral: Negative.      Physical Exam Triage Vital Signs ED Triage Vitals  Enc Vitals Group  BP 11/06/21 1315 130/73     Pulse Rate 11/06/21 1315 73     Resp 11/06/21 1315 14     Temp 11/06/21 1315 98.6 F (37 C)     Temp Source 11/06/21 1315 Oral     SpO2 11/06/21 1315 96 %     Weight 11/06/21 1313 177 lb 0.5 oz (80.3 kg)     Height 11/06/21 1313 5\' 1"  (1.549 m)     Head Circumference --      Peak Flow --      Pain Score 11/06/21 1313 6     Pain Loc --      Pain Edu? --      Excl. in GC? --    No data found.  Updated Vital Signs BP 130/73 (BP Location: Left Arm)   Pulse 73   Temp 98.6 F (37 C) (Oral)   Resp 14   Ht 5\' 1"  (1.549 m)   Wt 177 lb 0.5 oz (80.3 kg)   SpO2 96%   BMI 33.45 kg/m   Visual Acuity Right Eye  Distance:   Left Eye Distance:   Bilateral Distance:    Right Eye Near:   Left Eye Near:    Bilateral Near:     Physical Exam Vitals and nursing note reviewed.  Constitutional:      General: She is not in acute distress.    Appearance: Normal appearance. She is normal weight. She is not ill-appearing.  HENT:     Head: Normocephalic and atraumatic.     Right Ear: Tympanic membrane, ear canal and external ear normal. There is no impacted cerumen.     Left Ear: Tympanic membrane, ear canal and external ear normal. There is no impacted cerumen.     Nose: Congestion and rhinorrhea present.     Mouth/Throat:     Mouth: Mucous membranes are moist.     Pharynx: Oropharynx is clear. Posterior oropharyngeal erythema present.  Cardiovascular:     Rate and Rhythm: Normal rate and regular rhythm.     Pulses: Normal pulses.     Heart sounds: Normal heart sounds. No murmur heard.   No gallop.  Pulmonary:     Effort: Pulmonary effort is normal.     Breath sounds: Normal breath sounds. No wheezing, rhonchi or rales.  Musculoskeletal:     Cervical back: Normal range of motion and neck supple.  Lymphadenopathy:     Cervical: No cervical adenopathy.  Skin:    General: Skin is warm and dry.     Capillary Refill: Capillary refill takes less than 2 seconds.     Findings: No erythema or rash.  Neurological:     General: No focal deficit present.     Mental Status: She is alert and oriented to person, place, and time.  Psychiatric:        Mood and Affect: Mood normal.        Behavior: Behavior normal.        Thought Content: Thought content normal.        Judgment: Judgment normal.     UC Treatments / Results  Labs (all labs ordered are listed, but only abnormal results are displayed) Labs Reviewed  GROUP A STREP BY PCR    EKG   Radiology No results found.  Procedures Procedures (including critical care time)  Medications Ordered in UC Medications - No data to  display  Initial Impression / Assessment and Plan / UC Course  I have reviewed  the triage vital signs and the nursing notes.  Pertinent labs & imaging results that were available during my care of the patient were reviewed by me and considered in my medical decision making (see chart for details).  Patient is a nontoxic-appearing 60 year old female here for evaluation of respiratory complaints as outlined in HPI above.  Patient's physical exam reveals pearly gray tympanic membranes bilaterally with normal light reflex and clear external auditory canals.  Nasal mucosa is erythematous and edematous with scant clear nasal discharge.  Oropharyngeal exam reveals posterior oropharyngeal erythema and injection with clear postnasal drip.  Tonsillar pillars are unremarkable.  No cervical lymphadenopathy appreciated on exam.  Cardiopulmonary exam reveals clear lung sounds in all fields.  Patient swabbed for strep at triage and it is negative.  His exam is consistent with a viral URI with a cough.  Given her recent RSV exposure and it could very well be RSV.  We will treat patient with Atrovent nasal spray to help with the nasal congestion runny nose, Tessalon Perles and Promethazine DM to help with cough and congestion.  Patient advised to return for new or worsening symptoms.   Final Clinical Impressions(s) / UC Diagnoses   Final diagnoses:  Viral URI with cough     Discharge Instructions      Use the Atrovent nasal spray, 2 squirts in each nostril every 6 hours, as needed for runny nose and postnasal drip.  Use the Tessalon Perles every 8 hours during the day.  Take them with a small sip of water.  They may give you some numbness to the base of your tongue or a metallic taste in your mouth, this is normal.  Use the Promethazine DM cough syrup at bedtime for cough and congestion.  It will make you drowsy so do not take it during the day.  Return for reevaluation or see your primary care provider for  any new or worsening symptoms.      ED Prescriptions     Medication Sig Dispense Auth. Provider   benzonatate (TESSALON) 100 MG capsule Take 2 capsules (200 mg total) by mouth every 8 (eight) hours. 21 capsule Becky Augusta, NP   ipratropium (ATROVENT) 0.06 % nasal spray Place 2 sprays into both nostrils 4 (four) times daily. 15 mL Becky Augusta, NP   promethazine-dextromethorphan (PROMETHAZINE-DM) 6.25-15 MG/5ML syrup Take 5 mLs by mouth 4 (four) times daily as needed. 118 mL Becky Augusta, NP      PDMP not reviewed this encounter.   Becky Augusta, NP 11/06/21 1354

## 2021-11-06 NOTE — ED Triage Notes (Addendum)
Patient reports head congestion and runny nose on Friday.  Patient c/o sore throat that started this morning.  Patient denies fevers.  Patient states that she has been around her grandson who has RSV.

## 2022-02-08 ENCOUNTER — Other Ambulatory Visit: Payer: Self-pay | Admitting: Family Medicine

## 2022-02-08 DIAGNOSIS — R310 Gross hematuria: Secondary | ICD-10-CM

## 2022-02-09 ENCOUNTER — Other Ambulatory Visit: Payer: Self-pay

## 2022-02-09 ENCOUNTER — Ambulatory Visit
Admission: RE | Admit: 2022-02-09 | Discharge: 2022-02-09 | Disposition: A | Discharge status: Home / Self Care | Payer: 59 | Source: Ambulatory Visit | Attending: Family Medicine | Admitting: Family Medicine

## 2022-02-09 DIAGNOSIS — R310 Gross hematuria: Secondary | ICD-10-CM | POA: Insufficient documentation

## 2022-03-01 ENCOUNTER — Ambulatory Visit: Admission: RE | Admit: 2022-03-01 | Payer: 59 | Source: Ambulatory Visit

## 2022-03-01 ENCOUNTER — Other Ambulatory Visit: Payer: Self-pay | Admitting: Family Medicine

## 2022-03-01 DIAGNOSIS — R1032 Left lower quadrant pain: Secondary | ICD-10-CM

## 2022-03-01 DIAGNOSIS — N2889 Other specified disorders of kidney and ureter: Secondary | ICD-10-CM

## 2022-03-01 DIAGNOSIS — N201 Calculus of ureter: Secondary | ICD-10-CM

## 2023-09-23 IMAGING — US US RENAL
1 series · 14 of 25 positions shown · non-contrast
Comparison: None.

CLINICAL DATA: Gross hematuria.  Left flank pain

EXAM:
RENAL / URINARY TRACT ULTRASOUND COMPLETE

[Series 1: us renal · 0.26mm/px · 14 of 48 slices shown]
[im 1/48]
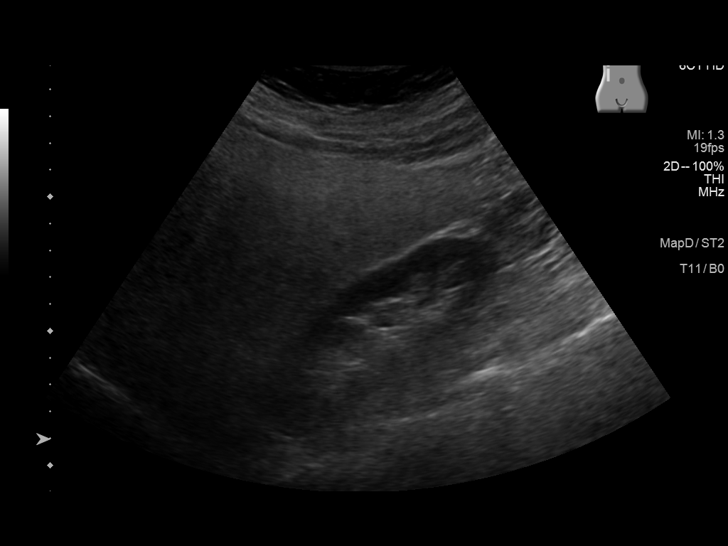
[im 4/48]
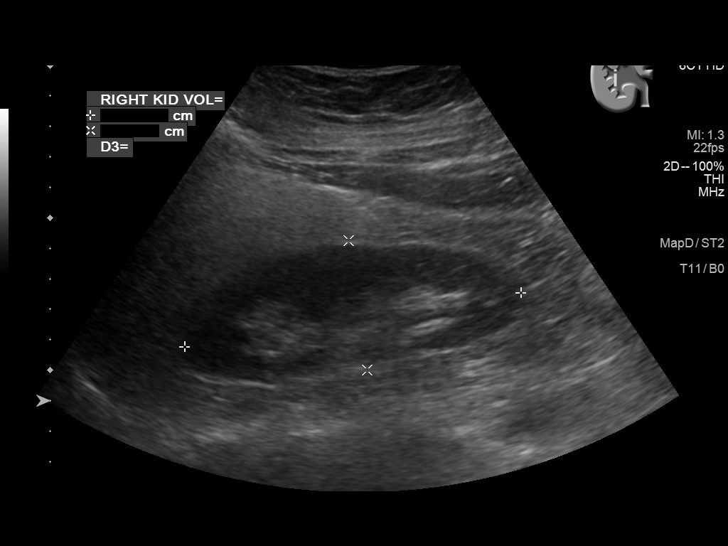
[im 8/48]
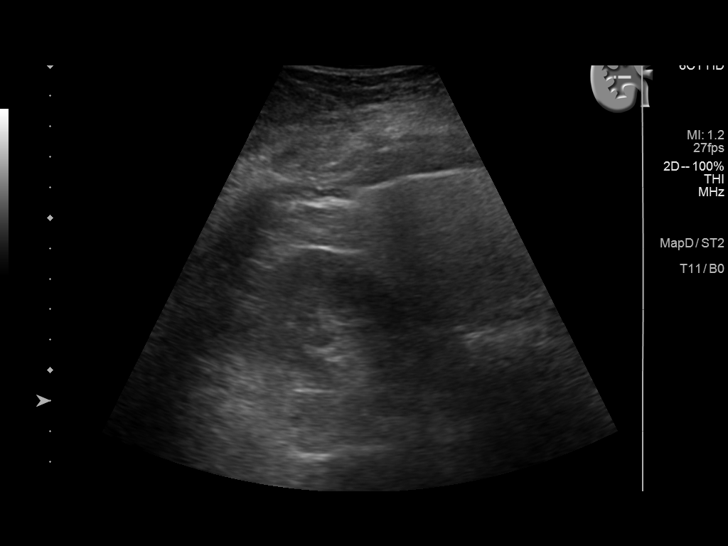
[im 12/48]
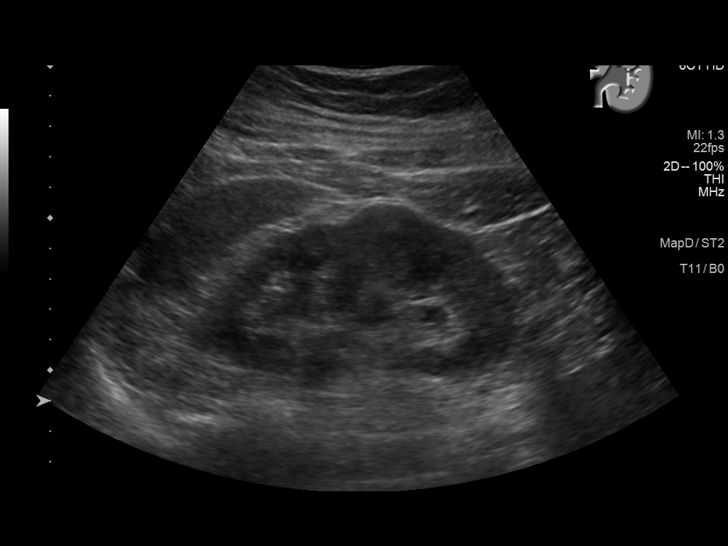
[im 16/48]
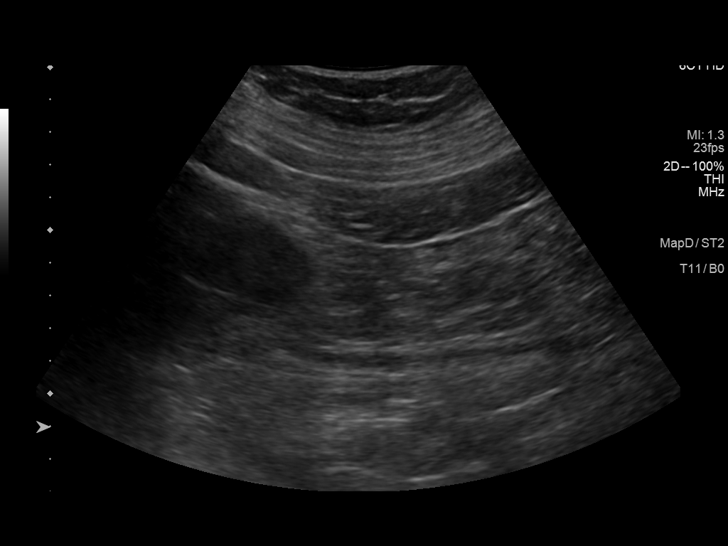
[im 18/48]
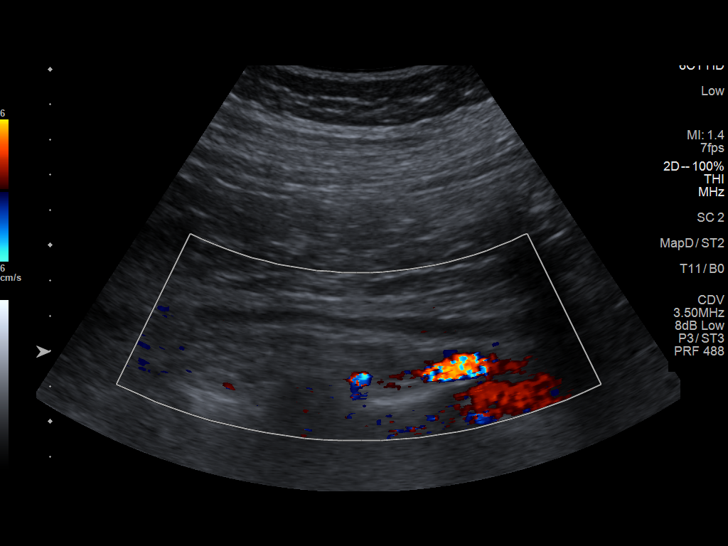
[im 22/48]
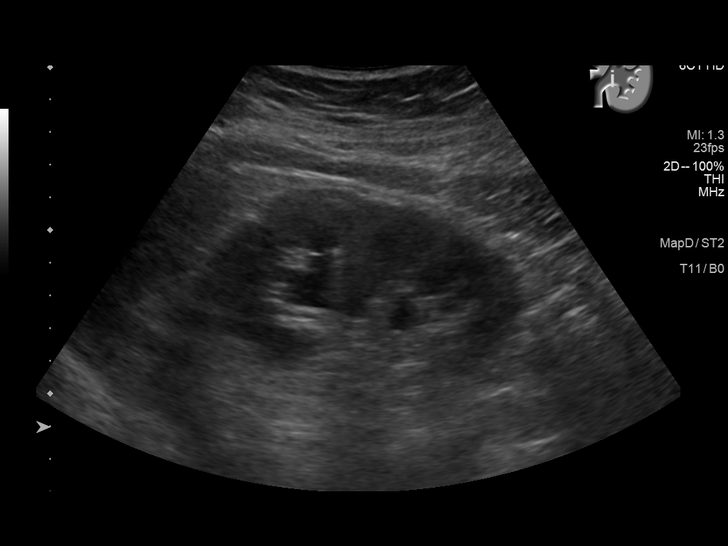
[im 26/48]
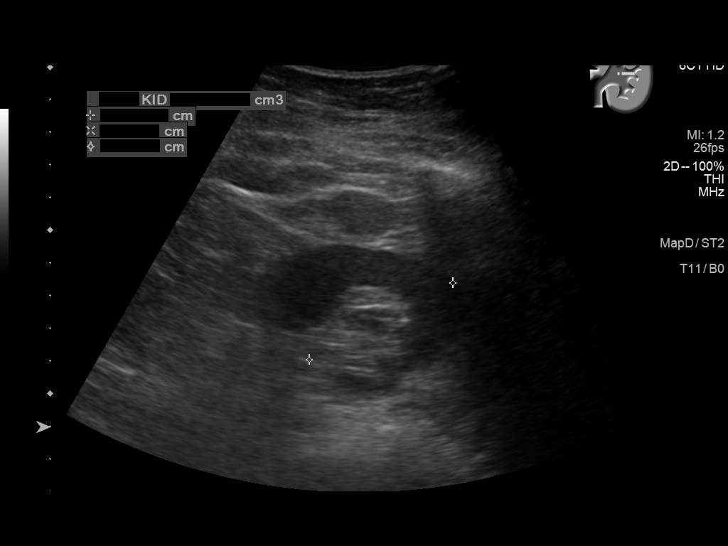
[im 30/48]
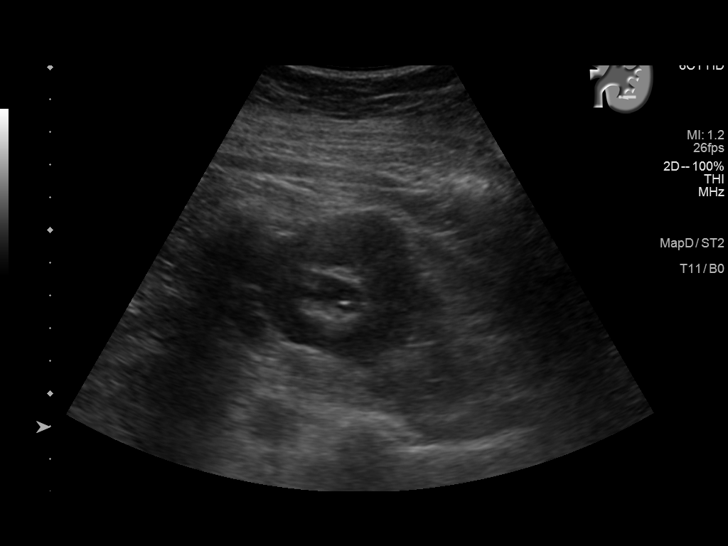
[im 32/48]
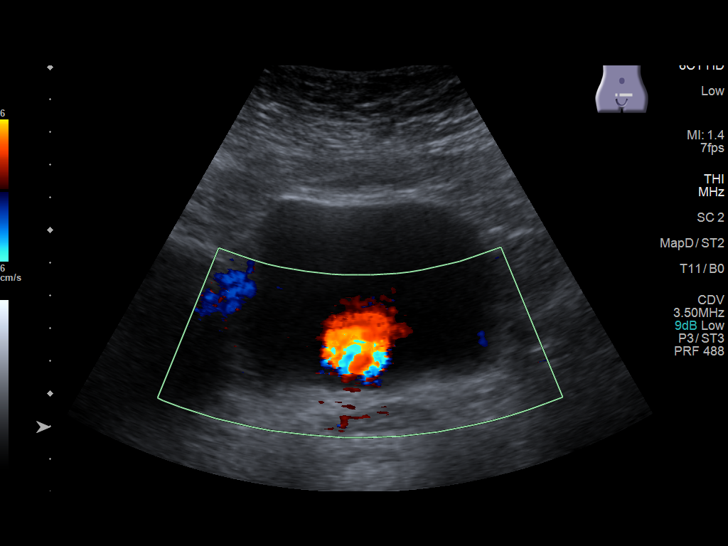
[im 36/48]
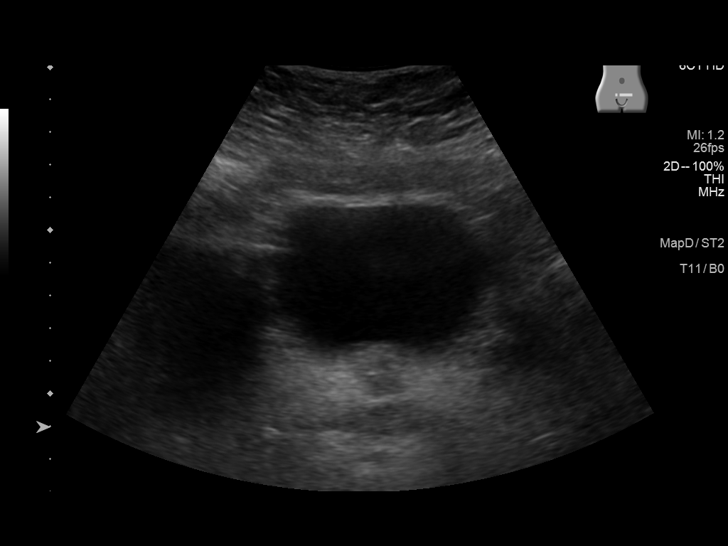
[im 40/48]
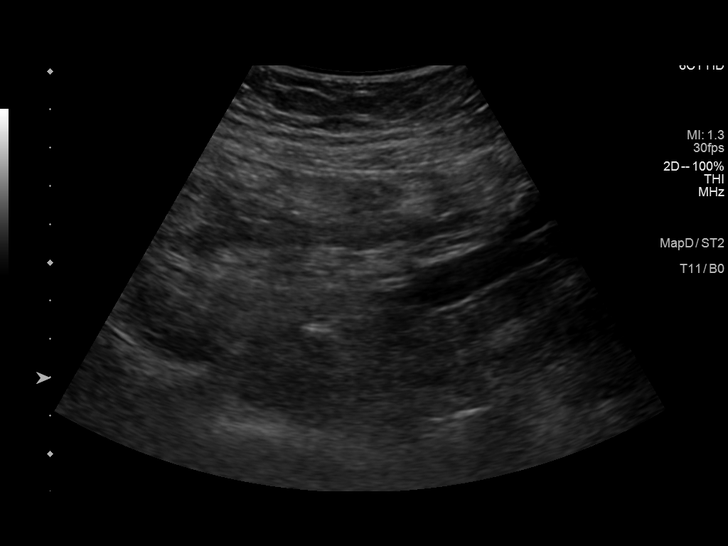
[im 44/48]
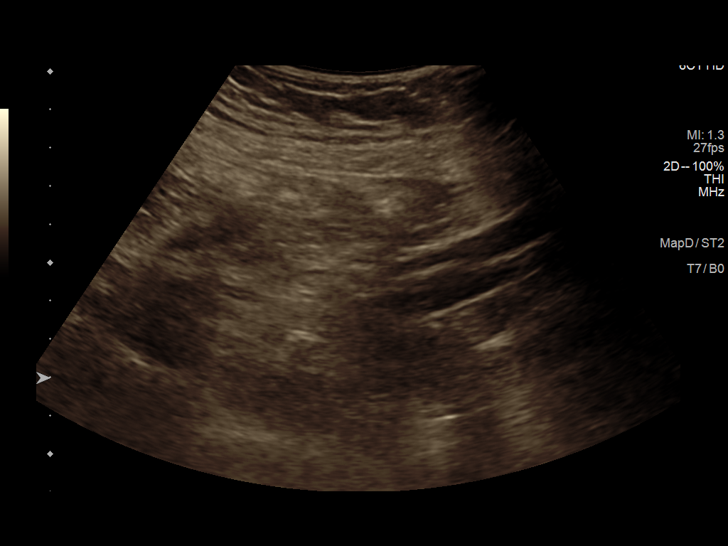
[im 48/48]
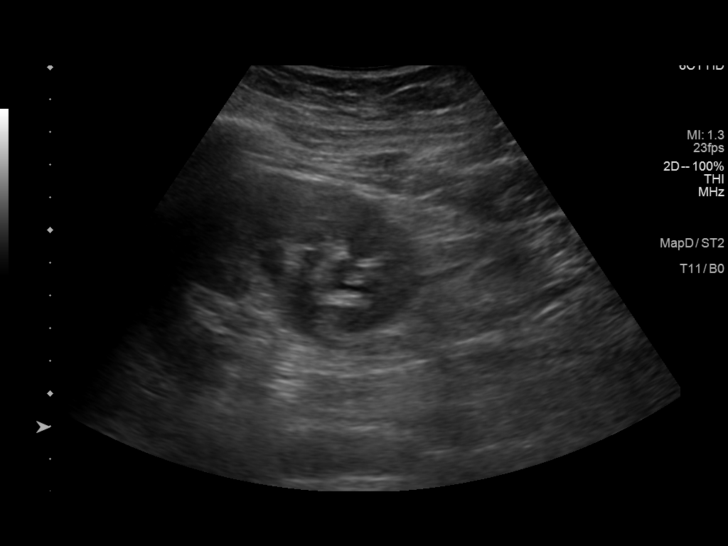

[14 of 25 positions shown; findings below may reference images not displayed]

FINDINGS: Right Kidney:

Renal measurements: 11.2 x 4.3 x 4.4 cm = volume: 110 mL.
Echogenicity within normal limits. No mass or hydronephrosis
visualized.

Left Kidney:

Renal measurements: 10.4 x 5.4 x 5 cm = volume: 146 mL. Mild
hydronephrosis and hydroureter associated with a distal ureteral
stone measuring 6 mm. At the lower pole parenchyma is a hyperechoic
8 mm masslike appearance.

Bladder:

Appears normal for degree of bladder distention. Bilateral jets were
seen.

Other:

Hepatic steatosis
IMPRESSION: 1. 6 mm distal left ureteral stone, partially obstructive with mild
hydronephrosis and maintained left ureteral jet.
2. 8 mm echogenic mass at the lower pole left kidney, classically an
angiomyolipoma but not a diagnostic finding. If no outside CT or MRI
comparison is available, consider six-month follow-up ultrasound or
CT.
3. Hepatic steatosis.

## 2024-01-13 ENCOUNTER — Ambulatory Visit
Admission: EM | Admit: 2024-01-13 | Discharge: 2024-01-13 | Disposition: A | Discharge status: Home / Self Care | Payer: 59 | Attending: Family Medicine | Admitting: Family Medicine

## 2024-01-13 ENCOUNTER — Encounter: Payer: Self-pay | Admitting: Emergency Medicine

## 2024-01-13 ENCOUNTER — Ambulatory Visit (INDEPENDENT_AMBULATORY_CARE_PROVIDER_SITE_OTHER): Payer: 59

## 2024-01-13 DIAGNOSIS — F17210 Nicotine dependence, cigarettes, uncomplicated: Secondary | ICD-10-CM | POA: Diagnosis present

## 2024-01-13 DIAGNOSIS — R0602 Shortness of breath: Secondary | ICD-10-CM | POA: Diagnosis present

## 2024-01-13 DIAGNOSIS — J069 Acute upper respiratory infection, unspecified: Secondary | ICD-10-CM | POA: Insufficient documentation

## 2024-01-13 LAB — RESP PANEL BY RT-PCR (RSV, FLU A&B, COVID)  RVPGX2
Influenza A by PCR: NEGATIVE
Influenza B by PCR: NEGATIVE
Resp Syncytial Virus by PCR: NEGATIVE
SARS Coronavirus 2 by RT PCR: NEGATIVE

## 2024-01-13 MED ORDER — ALBUTEROL SULFATE HFA 108 (90 BASE) MCG/ACT IN AERS: 1.0000 | INHALATION_SPRAY | Freq: Four times a day (QID) | RESPIRATORY_TRACT | 0 refills | Status: AC | PRN

## 2024-01-13 MED ORDER — PREDNISONE 10 MG (21) PO TBPK: ORAL_TABLET | Freq: Every day | ORAL | 0 refills | Status: DC

## 2024-01-13 MED ORDER — PROMETHAZINE-DM 6.25-15 MG/5ML PO SYRP: 5.0000 mL | ORAL_SOLUTION | Freq: Four times a day (QID) | ORAL | 0 refills | Status: DC | PRN

## 2024-01-13 NOTE — ED Provider Notes (Signed)
[facility]    CSN: [id number] Arrival date & time: 01/13/24  0957      History   Chief Complaint Chief Complaint  Patient presents with   Cough   Shortness of Breath    HPI Ana Clark is a 63 y.o. female.   HPI  History obtained from the patient. Ana Clark presents for wheezing, cough with shortness of breath that started Friday evening.  Endorses sore throat and rhinorrhea.  Denies fever, abdominal pain, vomiting and diarrhea.  Has been using Halls cough lozenges for sore throat. Denies painful swallowing.  No known sick contacts.    No history of asthma.  Smokes cigarettes daily.       Past Medical History:  Diagnosis Date   Anxiety    Depression    Headache    History of panic attacks    Nonalcoholic fatty liver disease    Rheumatoid arthritis (HCC)     There are no active problems to display for this patient.   Past Surgical History:  Procedure Laterality Date   ABDOMINAL HYSTERECTOMY     CARPAL TUNNEL RELEASE Right 06/29/2016   Procedure: CARPAL TUNNEL RELEASE;  Surgeon: Kennedy Bucker, MD;  Location: [facility] ORS;  Service: Orthopedics;  Laterality: Right;    OB History   No obstetric history on file.      Home Medications    Prior to Admission medications   Medication Sig Start Date End Date Taking? Authorizing Provider  predniSONE (STERAPRED UNI-PAK 21 TAB) 10 MG (21) TBPK tablet Take by mouth daily. Take 6 tabs by mouth daily for 1, then 5 tabs for 1 day, then 4 tabs for 1 day, then 3 tabs for 1 day, then 2 tabs for 1 day, then 1 tab for 1 day. 01/13/24  Yes Aylene Acoff, DO  albuterol (VENTOLIN HFA) 108 (90 Base) MCG/ACT inhaler Inhale 1-2 puffs into the lungs every 6 (six) hours as needed for wheezing or shortness of breath. 01/13/24   Zackary Mckeone, DO  benzonatate (TESSALON) 100 MG capsule Take 2 capsules (200 mg total) by mouth every 8 (eight) hours. 11/06/21   Becky Augusta, NP  folic acid (FOLVITE) 1 MG tablet  10/03/18   [provider]  ipratropium (ATROVENT) 0.06 % nasal spray Place 2 sprays into both nostrils 4 (four) times daily. 11/06/21   Becky Augusta, NP  methotrexate (RHEUMATREX) 2.5 MG tablet TK 8 TS PO ONCE Q 7 DAYS 10/03/18   [provider]  promethazine-dextromethorphan (PROMETHAZINE-DM) 6.25-15 MG/5ML syrup Take 5 mLs by mouth 4 (four) times daily as needed. 01/13/24   Katha Cabal, DO    Family History Family History  Problem Relation Age of Onset   Diabetes Mother    Hypertension Mother    Other Father        unknown medical history    Social History Social History   Tobacco Use   Smoking status: Every Day    Current packs/day: 0.50    Types: Cigarettes   Smokeless tobacco: Never  Vaping Use   Vaping status: Never Used  Substance Use Topics   Alcohol use: No   Drug use: No     Allergies   Septra [sulfamethoxazole-trimethoprim]   Review of Systems Review of Systems: negative unless otherwise stated in HPI.      Physical Exam Triage Vital Signs ED Triage Vitals  Encounter Vitals Group     BP 01/13/24 1015 136/81     Systolic BP Percentile --  Diastolic BP Percentile --      Pulse Rate 01/13/24 1015 66     Resp 01/13/24 1015 15     Temp 01/13/24 1015 98.4 F (36.9 C)     Temp Source 01/13/24 1015 Oral     SpO2 01/13/24 1015 99 %     Weight 01/13/24 1013 177 lb 0.5 oz (80.3 kg)     Height 01/13/24 1013 5\' 1"  (1.549 m)     Head Circumference --      Peak Flow --      Pain Score 01/13/24 1012 0     Pain Loc --      Pain Education --      Exclude from Growth Chart --    No data found.  Updated Vital Signs BP 136/81 (BP Location: Left Arm)   Pulse 66   Temp 98.4 F (36.9 C) (Oral)   Resp 15   Ht 5\' 1"  (1.549 m)   Wt 80.3 kg   SpO2 99%   BMI 33.45 kg/m   Visual Acuity Right Eye Distance:   Left Eye Distance:   Bilateral Distance:    Right Eye Near:   Left Eye Near:    Bilateral Near:     Physical Exam GEN:     alert, non-toxic  appearing female in no distress    HENT:  mucus membranes moist, oropharyngeal without lesions or erythema, no tonsillar hypertrophy or exudates, clear nasal discharge EYES:   pupils equal and reactive, no scleral injection or discharge NECK:  normal ROM, no meningismus   RESP:  no increased work of breathing, clear but decreased air movement bilaterally CVS:   regular rate and rhythm Skin:   warm and dry, no cyanosis     UC Treatments / Results  Labs (all labs ordered are listed, but only abnormal results are displayed) Labs Reviewed  RESP PANEL BY RT-PCR (RSV, FLU A&B, COVID)  [id number]    EKG   Radiology DG Chest 2 View Result Date: 01/13/2024 CLINICAL DATA:  Shortness of breath. EXAM: CHEST - 2 VIEW COMPARISON:  11/07/2016 FINDINGS: The lungs are clear without focal pneumonia, edema, pneumothorax or pleural effusion. The cardiopericardial silhouette is within normal limits for size. No acute bony abnormality. IMPRESSION: No active cardiopulmonary disease. Electronically Signed   By: Kennith Center M.D.   On: 01/13/2024 12:11    Procedures Procedures (including critical care time)  Medications Ordered in UC Medications - No data to display  Initial Impression / Assessment and Plan / UC Course  I have reviewed the triage vital signs and the nursing notes.  Pertinent labs & imaging results that were available during my care of the patient were reviewed by me and considered in my medical decision making (see chart for details).       Pt is a 64 y.o. female who presents for 2 days of respiratory symptoms. [name] is afebrile here. Satting well on room air. Overall pt is non-toxic appearing, well hydrated, without respiratory distress. Pulmonary exam is remarkable for decreased air movement bilaterally.  COVID, RSV and influenza panel obtained and was negative.  Recommended chest x-ray and she is agreeable.    Chest xray personally reviewed by me without focal pneumonia, pleural  effusion, cardiomegaly or pneumothorax. Patient aware the radiologist has not read her xray and is comfortable with the preliminary read by me. Will review radiologist read when available and call patient if a change in plan is warranted.  Pt agreeable to this  plan prior to discharge.   Suspect acute viral respiratory infection. Discussed symptomatic treatment.  Albuterol for wheezing and shortness of breath.  Prednisone taper and Promethazine DM prescribed. Explained lack of efficacy of antibiotics in viral disease.  Typical duration of symptoms discussed.   Return and ED precautions given and voiced understanding. Discussed MDM, treatment plan and plan for follow-up with patient who agrees with plan.   Radiologist impression reviewed.  Final Clinical Impressions(s) / UC Diagnoses   Final diagnoses:  SOB (shortness of breath)  Viral URI with cough  Cigarette smoker     Discharge Instructions      Your COVID, influenza and RSV test were all negative.  Your chest xray did not show evidence of pneumonia though the radiologist has not yet read it. If they find something that I didn't, I will call you.     You can take Tylenol and/or Ibuprofen as needed for fever reduction and pain relief.    For cough: Stop at the pharmacy to pick up your inhaler and prescription cough medication.  You can use a humidifier for chest congestion and cough.  If you don't have a humidifier, you can sit in the bathroom with the hot shower running.      For sore throat: try warm salt water gargles, Mucinex sore throat cough drops or cepacol lozenges, throat spray, warm tea or water with lemon/honey, popsicles or ice, or OTC cold relief medicine for throat discomfort. You can also purchase chloraseptic spray at the pharmacy or dollar store.   For congestion: take a daily anti-histamine like Zyrtec, Claritin, and a oral decongestant, such as pseudoephedrine.  You can also use Flonase 1-2 sprays in each nostril  daily. Afrin is also a good option, if you do not have high blood pressure.    It is important to stay hydrated: drink plenty of fluids (water, gatorade/powerade/pedialyte, juices, or teas) to keep your throat moisturized and help further relieve irritation/discomfort.    Return or go to the Emergency Department if symptoms worsen or do not improve in the next few days      ED Prescriptions     Medication Sig Dispense Auth. Provider   promethazine-dextromethorphan (PROMETHAZINE-DM) 6.25-15 MG/5ML syrup Take 5 mLs by mouth 4 (four) times daily as needed. 118 mL Kathyjo Briere, DO   albuterol (VENTOLIN HFA) 108 (90 Base) MCG/ACT inhaler Inhale 1-2 puffs into the lungs every 6 (six) hours as needed for wheezing or shortness of breath. 1 each Adan Baehr, DO   predniSONE (STERAPRED UNI-PAK 21 TAB) 10 MG (21) TBPK tablet Take by mouth daily. Take 6 tabs by mouth daily for 1, then 5 tabs for 1 day, then 4 tabs for 1 day, then 3 tabs for 1 day, then 2 tabs for 1 day, then 1 tab for 1 day. 21 tablet Katha Cabal, DO      PDMP not reviewed this encounter.   Katha Cabal, DO 01/13/24 1218

## 2024-01-13 NOTE — ED Triage Notes (Signed)
Patient reports cough, chest congestion, and SOB that started on Friday.  Patient denies COPD or Asthma.  Patient unsure of fevers.

## 2024-01-13 NOTE — Discharge Instructions (Addendum)
Your COVID, influenza and RSV test were all negative.  Your chest xray did not show evidence of pneumonia though the radiologist has not yet read it. If they find something that I didn't, I will call you.     You can take Tylenol and/or Ibuprofen as needed for fever reduction and pain relief.    For cough: Stop at the pharmacy to pick up your inhaler and prescription cough medication.  You can use a humidifier for chest congestion and cough.  If you don't have a humidifier, you can sit in the bathroom with the hot shower running.      For sore throat: try warm salt water gargles, Mucinex sore throat cough drops or cepacol lozenges, throat spray, warm tea or water with lemon/honey, popsicles or ice, or OTC cold relief medicine for throat discomfort. You can also purchase chloraseptic spray at the pharmacy or dollar store.   For congestion: take a daily anti-histamine like Zyrtec, Claritin, and a oral decongestant, such as pseudoephedrine.  You can also use Flonase 1-2 sprays in each nostril daily. Afrin is also a good option, if you do not have high blood pressure.    It is important to stay hydrated: drink plenty of fluids (water, gatorade/powerade/pedialyte, juices, or teas) to keep your throat moisturized and help further relieve irritation/discomfort.    Return or go to the Emergency Department if symptoms worsen or do not improve in the next few days

## 2024-11-23 ENCOUNTER — Ambulatory Visit
Admission: EM | Admit: 2024-11-23 | Discharge: 2024-11-23 | Disposition: A | Discharge status: Home / Self Care | Attending: Physician Assistant | Admitting: Physician Assistant

## 2024-11-23 ENCOUNTER — Encounter: Payer: Self-pay | Admitting: Emergency Medicine

## 2024-11-23 DIAGNOSIS — B349 Viral infection, unspecified: Secondary | ICD-10-CM | POA: Diagnosis not present

## 2024-11-23 DIAGNOSIS — J029 Acute pharyngitis, unspecified: Secondary | ICD-10-CM

## 2024-11-23 DIAGNOSIS — R051 Acute cough: Secondary | ICD-10-CM | POA: Diagnosis not present

## 2024-11-23 DIAGNOSIS — R062 Wheezing: Secondary | ICD-10-CM

## 2024-11-23 DIAGNOSIS — F17200 Nicotine dependence, unspecified, uncomplicated: Secondary | ICD-10-CM

## 2024-11-23 LAB — POCT RAPID STREP A (OFFICE): Rapid Strep A Screen: NEGATIVE

## 2024-11-23 LAB — POC SOFIA SARS ANTIGEN FIA: SARS Coronavirus 2 Ag: NEGATIVE

## 2024-11-23 MED ORDER — PREDNISONE 20 MG PO TABS: 40.0000 mg | ORAL_TABLET | Freq: Every day | ORAL | 0 refills | Status: AC

## 2024-11-23 MED ORDER — PROMETHAZINE-DM 6.25-15 MG/5ML PO SYRP: 5.0000 mL | ORAL_SOLUTION | Freq: Four times a day (QID) | ORAL | 0 refills | Status: AC | PRN

## 2024-11-23 MED ORDER — LIDOCAINE VISCOUS HCL 2 % MT SOLN: 15.0000 mL | OROMUCOSAL | 0 refills | Status: AC | PRN

## 2024-11-23 NOTE — Discharge Instructions (Addendum)
-   Negative strep and COVID testing. - Symptoms are consistent with a viral illness and developing bronchitis.  Bronchitis can last for couple weeks.  You are likely more prone to bronchitis since you are a smoker.  We discussed getting checked by your PCP or asking for a referral to be evaluated for possible COPD which is chronic bronchitis/emphysema.  These are smoking-related illnesses.  We discussed if you have underlying COPD things are done a little differently as far as treatment goes whenever you become ill.  You also may benefit from an inhaler. - Try to stop smoking. -If you develop a fever, have worsening cough, increased chest discomfort, increased breathing difficulty or worsening wheezing please return for reevaluation and consideration of a chest x-ray to further evaluate for possible pneumonia but low suspicion for that at this time given improvement in symptoms and no associated fever.

## 2024-11-23 NOTE — ED Triage Notes (Signed)
 Patient c/o sore throat, cough and congestion that started on Wed.  Patient denies recent fevers.

## 2024-11-23 NOTE — ED Provider Notes (Signed)
 [facility]    CSN: [id number] Arrival date & time: 11/23/24  0854      History   Chief Complaint Chief Complaint  Patient presents with   Cough   Sore Throat    HPI Ana Clark is a 63 y.o. female presenting for 4-day history of fatigue, sore throat, cough and congestion.  Patient reports feeling feverish for the first day but has not felt that way since.  She denies ear pain, sinus pain, chest pain or shortness of breath but has noted some wheezing.  She is a smoker.  Denies any history of diagnosed COPD.  Has been taking OTC meds and reports symptoms have improved a little bit from onset.  Reports she has been around her sick grandchild.  No other complaints.  HPI  Past Medical History:  Diagnosis Date   Anxiety    Depression    Headache    History of panic attacks    Nonalcoholic fatty liver disease    Rheumatoid arthritis (HCC)     There are no active problems to display for this patient.   Past Surgical History:  Procedure Laterality Date   ABDOMINAL HYSTERECTOMY     CARPAL TUNNEL RELEASE Right 06/29/2016   Procedure: CARPAL TUNNEL RELEASE;  Surgeon: Ozell Flake, MD;  Location: [facility] OR[facility];  Service: Orthopedics;  Laterality: Right;    OB History   No obstetric history on file.      Home Medications    Prior to Admission medications   Medication Sig Start Date End Date Taking? Authorizing Provider  lidocaine  (XYLOCAINE ) 2 % solution Use as directed 15 mLs in the mouth or throat every 3 (three) hours as needed for mouth pain (swish and spit). 11/23/24  Yes Arvis Jolan NOVAK, PA-C  predniSONE  (DELTASONE ) 20 MG tablet Take 2 tablets (40 mg total) by mouth daily for 5 days. 11/23/24 11/28/24 Yes Arvis Jolan B, PA-C  promethazine -dextromethorphan (PROMETHAZINE -DM) 6.25-15 MG/5ML syrup Take 5 mLs by mouth 4 (four) times daily as needed. 11/23/24  Yes Arvis Jolan B, PA-C  ZEPBOUND 2.5 MG/0.5ML Pen Inject 2.5 mg into the skin once a week. 11/17/24 02/15/25  Yes [provider]  albuterol  (VENTOLIN  HFA) 108 (90 Base) MCG/ACT inhaler Inhale 1-2 puffs into the lungs every 6 (six) hours as needed for wheezing or shortness of breath. 01/13/24   Brimage, Vondra, DO  folic acid (FOLVITE) 1 MG tablet  10/03/18   [provider]  methotrexate (RHEUMATREX) 2.5 MG tablet TK 8 TS PO ONCE Q 7 DAYS 10/03/18   [provider]    Family History Family History  Problem Relation Age of Onset   Diabetes Mother    Hypertension Mother    Other Father        unknown medical history    Social History Social History   Tobacco Use   Smoking status: Every Day    Current packs/day: 0.50    Types: Cigarettes   Smokeless tobacco: Never  Vaping Use   Vaping status: Never Used  Substance Use Topics   Alcohol use: No   Drug use: No     Allergies   Septra [sulfamethoxazole-trimethoprim]   Review of Systems Review of Systems  Constitutional:  Positive for fatigue. Negative for chills, diaphoresis and fever.  HENT:  Positive for congestion, postnasal drip, rhinorrhea and sore throat. Negative for ear pain, sinus pressure and sinus pain.   Respiratory:  Positive for cough and wheezing. Negative for shortness of breath.  Gastrointestinal:  Negative for abdominal pain, nausea and vomiting.  Musculoskeletal:  Negative for arthralgias and myalgias.  Skin:  Negative for rash.  Neurological:  Negative for weakness and headaches.  Hematological:  Negative for adenopathy.     Physical Exam Triage Vital Signs ED Triage Vitals  Encounter Vitals Group     BP 11/23/24 0905 113/74     Girls Systolic BP Percentile --      Girls Diastolic BP Percentile --      Boys Systolic BP Percentile --      Boys Diastolic BP Percentile --      Pulse Rate 11/23/24 0905 71     Resp 11/23/24 0905 15     Temp 11/23/24 0905 97.8 F (36.6 C)     Temp Source 11/23/24 0905 Oral     SpO2 11/23/24 0905 94 %     Weight 11/23/24 0902 177 lb 0.5 oz (80.3  kg)     Height 11/23/24 0902 5' 1 (1.549 m)     Head Circumference --      Peak Flow --      Pain Score 11/23/24 0902 8     Pain Loc --      Pain Education --      Exclude from Growth Chart --    No data found.  Updated Vital Signs BP 113/74 (BP Location: Left Arm)   Pulse 71   Temp 97.8 F (36.6 C) (Oral)   Resp 15   Ht 5' 1 (1.549 m)   Wt 177 lb 0.5 oz (80.3 kg)   SpO2 94%   BMI 33.45 kg/m   Physical Exam Vitals and nursing note reviewed.  Constitutional:      General: She is not in acute distress.    Appearance: Normal appearance. She is not ill-appearing or toxic-appearing.  HENT:     Head: Normocephalic and atraumatic.     Nose: Nose normal. No congestion.     Mouth/Throat:     Mouth: Mucous membranes are moist.     Pharynx: Oropharynx is clear. Posterior oropharyngeal erythema present.  Eyes:     General: No scleral icterus.       Right eye: No discharge.        Left eye: No discharge.     Conjunctiva/sclera: Conjunctivae normal.  Cardiovascular:     Rate and Rhythm: Normal rate and regular rhythm.     Heart sounds: Normal heart sounds.  Pulmonary:     Effort: Pulmonary effort is normal. No respiratory distress.     Breath sounds: Wheezing (upper lung fields) present.  Musculoskeletal:     Cervical back: Neck supple.  Skin:    General: Skin is dry.  Neurological:     General: No focal deficit present.     Mental Status: She is alert. Mental status is at baseline.     Motor: No weakness.     Gait: Gait normal.  Psychiatric:        Mood and Affect: Mood normal.        Behavior: Behavior normal.      UC Treatments / Results  Labs (all labs ordered are listed, but only abnormal results are displayed) Labs Reviewed  POC SOFIA SARS ANTIGEN FIA - Normal  POCT RAPID STREP A (OFFICE) - Normal    EKG   Radiology No results found.  Procedures Procedures (including critical care time)  Medications Ordered in UC Medications - No data to  display  Initial Impression /  Assessment and Plan / UC Course  I have reviewed the triage vital signs and the nursing notes.  Pertinent labs & imaging results that were available during my care of the patient were reviewed by me and considered in my medical decision making (see chart for details).   63 year old female with history of tobacco abuse presents for cough, congestion, sore throat, fatigue and wheezing x 4 days.  Symptoms have improved mildly from onset.  No recorded fever.  No history of COPD.  Vital stable and normal.  She is overall well-appearing and in no acute distress.  On exam has nasal congestion, mild erythema posterior pharynx.  Scattered wheezes throughout bilateral upper lung fields.  Rapid strep negative. Rapid COVID-negative.  Viral illness.  Possible bronchitis.  Advised patient to speak with PCP about getting checked for possible COPD.  Will treat at this time with prednisone , Promethazine  DM and viscous lidocaine .  Encouraged increasing rest and fluids.  We discussed possibly obtaining a chest x-ray but I explained my suspicion for pneumonia is relatively low at this time since she does not have a fever, is not feeling short of breath and symptoms have improved.  Patient elects to hold off on imaging at this time.  She will return if she develops a fever, has worsening cough or increased breathing problem.   Final Clinical Impressions(s) / UC Diagnoses   Final diagnoses:  Acute cough  Sore throat  Viral illness  Wheezing  Smoker     Discharge Instructions      - Negative strep and COVID testing. - Symptoms are consistent with a viral illness and developing bronchitis.  Bronchitis can last for couple weeks.  You are likely more prone to bronchitis since you are a smoker.  We discussed getting checked by your PCP or asking for a referral to be evaluated for possible COPD which is chronic bronchitis/emphysema.  These are smoking-related illnesses.  We  discussed if you have underlying COPD things are done a little differently as far as treatment goes whenever you become ill.  You also may benefit from an inhaler. - Try to stop smoking. -If you develop a fever, have worsening cough, increased chest discomfort, increased breathing difficulty or worsening wheezing please return for reevaluation and consideration of a chest x-ray to further evaluate for possible pneumonia but low suspicion for that at this time given improvement in symptoms and no associated fever.   ED Prescriptions     Medication Sig Dispense Auth. Provider   predniSONE  (DELTASONE ) 20 MG tablet Take 2 tablets (40 mg total) by mouth daily for 5 days. 10 tablet Arvis Huxley B, PA-C   promethazine -dextromethorphan (PROMETHAZINE -DM) 6.25-15 MG/5ML syrup Take 5 mLs by mouth 4 (four) times daily as needed. 118 mL Arvis Huxley B, PA-C   lidocaine  (XYLOCAINE ) 2 % solution Use as directed 15 mLs in the mouth or throat every 3 (three) hours as needed for mouth pain (swish and spit). 100 mL Arvis Huxley NOVAK, PA-C      PDMP not reviewed this encounter.   Arvis Huxley NOVAK, PA-C 11/23/24 [phone]
